# Patient Record
Sex: Female | Born: 1965
Health system: Southern US, Community
[De-identification: ages and names within clinical notes are randomized; demographics above are authoritative.]

## PROBLEM LIST (undated history)

## (undated) DIAGNOSIS — E0590 Thyrotoxicosis, unspecified without thyrotoxic crisis or storm: Secondary | ICD-10-CM

## (undated) DIAGNOSIS — I890 Lymphedema, not elsewhere classified: Secondary | ICD-10-CM

## (undated) DIAGNOSIS — E785 Hyperlipidemia, unspecified: Secondary | ICD-10-CM

## (undated) DIAGNOSIS — J45909 Unspecified asthma, uncomplicated: Secondary | ICD-10-CM

## (undated) DIAGNOSIS — J40 Bronchitis, not specified as acute or chronic: Secondary | ICD-10-CM

## (undated) DIAGNOSIS — I10 Essential (primary) hypertension: Secondary | ICD-10-CM

## (undated) HISTORY — PX: OTHER SURGICAL HISTORY: SHX169

## (undated) HISTORY — DX: Thyrotoxicosis, unspecified without thyrotoxic crisis or storm: E05.90

## (undated) HISTORY — DX: Hyperlipidemia, unspecified: E78.5

## (undated) HISTORY — DX: Lymphedema, not elsewhere classified: I89.0

---

## 2001-02-25 ENCOUNTER — Encounter: Payer: Self-pay | Admitting: Internal Medicine

## 2001-02-25 ENCOUNTER — Ambulatory Visit (HOSPITAL_COMMUNITY): Admission: RE | Admit: 2001-02-25 | Discharge: 2001-02-25 | Payer: Self-pay | Admitting: Internal Medicine

## 2002-03-05 ENCOUNTER — Ambulatory Visit (HOSPITAL_COMMUNITY): Admission: RE | Admit: 2002-03-05 | Discharge: 2002-03-05 | Payer: Self-pay | Admitting: Internal Medicine

## 2002-03-05 ENCOUNTER — Encounter: Payer: Self-pay | Admitting: Internal Medicine

## 2007-08-25 ENCOUNTER — Ambulatory Visit (HOSPITAL_COMMUNITY): Admission: RE | Admit: 2007-08-25 | Discharge: 2007-08-25 | Payer: Self-pay | Admitting: Internal Medicine

## 2008-03-08 ENCOUNTER — Encounter (HOSPITAL_COMMUNITY): Admission: RE | Admit: 2008-03-08 | Discharge: 2008-04-07 | Payer: Self-pay | Admitting: Internal Medicine

## 2012-01-06 ENCOUNTER — Emergency Department (HOSPITAL_COMMUNITY): Payer: 59

## 2012-01-06 ENCOUNTER — Encounter (HOSPITAL_COMMUNITY): Payer: Self-pay | Admitting: *Deleted

## 2012-01-06 ENCOUNTER — Observation Stay (HOSPITAL_COMMUNITY)
Admission: EM | Admit: 2012-01-06 | Discharge: 2012-01-07 | Disposition: A | Discharge status: Home / Self Care | Payer: 59 | Attending: Family Medicine | Admitting: Family Medicine

## 2012-01-06 DIAGNOSIS — J209 Acute bronchitis, unspecified: Secondary | ICD-10-CM | POA: Diagnosis present

## 2012-01-06 DIAGNOSIS — I10 Essential (primary) hypertension: Secondary | ICD-10-CM | POA: Diagnosis present

## 2012-01-06 DIAGNOSIS — E119 Type 2 diabetes mellitus without complications: Secondary | ICD-10-CM | POA: Diagnosis present

## 2012-01-06 DIAGNOSIS — J40 Bronchitis, not specified as acute or chronic: Principal | ICD-10-CM | POA: Insufficient documentation

## 2012-01-06 DIAGNOSIS — Z79899 Other long term (current) drug therapy: Secondary | ICD-10-CM | POA: Insufficient documentation

## 2012-01-06 DIAGNOSIS — F32A Depression, unspecified: Secondary | ICD-10-CM | POA: Diagnosis present

## 2012-01-06 DIAGNOSIS — I890 Lymphedema, not elsewhere classified: Secondary | ICD-10-CM | POA: Insufficient documentation

## 2012-01-06 DIAGNOSIS — R0602 Shortness of breath: Secondary | ICD-10-CM | POA: Insufficient documentation

## 2012-01-06 DIAGNOSIS — F3289 Other specified depressive episodes: Secondary | ICD-10-CM | POA: Insufficient documentation

## 2012-01-06 DIAGNOSIS — R739 Hyperglycemia, unspecified: Secondary | ICD-10-CM

## 2012-01-06 DIAGNOSIS — E876 Hypokalemia: Secondary | ICD-10-CM | POA: Diagnosis present

## 2012-01-06 DIAGNOSIS — F329 Major depressive disorder, single episode, unspecified: Secondary | ICD-10-CM | POA: Insufficient documentation

## 2012-01-06 DIAGNOSIS — R059 Cough, unspecified: Secondary | ICD-10-CM | POA: Insufficient documentation

## 2012-01-06 DIAGNOSIS — R05 Cough: Secondary | ICD-10-CM | POA: Insufficient documentation

## 2012-01-06 HISTORY — DX: Bronchitis, not specified as acute or chronic: J40

## 2012-01-06 HISTORY — DX: Essential (primary) hypertension: I10

## 2012-01-06 LAB — BASIC METABOLIC PANEL
CO2: 23 mEq/L (ref 19–32)
Chloride: 94 mEq/L — ABNORMAL LOW (ref 96–112)
Creatinine, Ser: 0.6 mg/dL (ref 0.50–1.10)
Potassium: 2.9 mEq/L — ABNORMAL LOW (ref 3.5–5.1)

## 2012-01-06 LAB — HEPATIC FUNCTION PANEL
ALT: 17 U/L (ref 0–35)
AST: 25 U/L (ref 0–37)
Bilirubin, Direct: <0.1 mg/dL (ref 0.0–0.3)
Total Bilirubin: 0.5 mg/dL (ref 0.3–1.2)

## 2012-01-06 LAB — DIFFERENTIAL
Basophils Absolute: 0.1 10*3/uL (ref 0.0–0.1)
Lymphocytes Relative: 13 % (ref 12–46)
Monocytes Absolute: 0.1 10*3/uL (ref 0.1–1.0)
Neutro Abs: 6.2 10*3/uL (ref 1.7–7.7)
Neutrophils Relative %: 85 % — ABNORMAL HIGH (ref 43–77)

## 2012-01-06 LAB — HEMOGLOBIN A1C
Hgb A1c MFr Bld: 7.6 % — ABNORMAL HIGH (ref <5.7)
Mean Plasma Glucose: 171 mg/dL — ABNORMAL HIGH (ref <117)

## 2012-01-06 LAB — CBC
HCT: 40.9 % (ref 36.0–46.0)
Hemoglobin: 13.9 g/dL (ref 12.0–15.0)
RDW: 12.5 % (ref 11.5–15.5)
WBC: 7.3 10*3/uL (ref 4.0–10.5)

## 2012-01-06 MED ORDER — METOPROLOL TARTRATE 100 MG PO TABS
100.0000 mg | ORAL_TABLET | Freq: Every day | ORAL | Status: DC
  Administered 2012-01-06 – 2012-01-07 (×2): 100 mg via ORAL
  Filled 2012-01-06 (×3): qty 1

## 2012-01-06 MED ORDER — HYDROCODONE-ACETAMINOPHEN 5-325 MG PO TABS: 1.0000 | ORAL_TABLET | ORAL | Status: DC | PRN

## 2012-01-06 MED ORDER — POTASSIUM CHLORIDE CRYS ER 20 MEQ PO TBCR
40.0000 meq | EXTENDED_RELEASE_TABLET | Freq: Once | ORAL | Status: AC
  Administered 2012-01-06: 40 meq via ORAL
  Filled 2012-01-06: qty 2

## 2012-01-06 MED ORDER — DEXTROMETHORPHAN-GUAIFENESIN 10-100 MG/5ML PO LIQD
10.0000 mL | ORAL | Status: DC | PRN
  Filled 2012-01-06: qty 10

## 2012-01-06 MED ORDER — ALBUTEROL SULFATE (5 MG/ML) 0.5% IN NEBU
2.5000 mg | INHALATION_SOLUTION | Freq: Four times a day (QID) | RESPIRATORY_TRACT | Status: DC
  Administered 2012-01-06 – 2012-01-07 (×4): 2.5 mg via RESPIRATORY_TRACT
  Filled 2012-01-06 (×4): qty 0.5

## 2012-01-06 MED ORDER — ONDANSETRON HCL 4 MG PO TABS: 4.0000 mg | ORAL_TABLET | Freq: Four times a day (QID) | ORAL | Status: DC | PRN

## 2012-01-06 MED ORDER — GUAIFENESIN-DM 100-10 MG/5ML PO SYRP: 10.0000 mL | ORAL_SOLUTION | ORAL | Status: DC | PRN

## 2012-01-06 MED ORDER — SODIUM CHLORIDE 0.9 % IV SOLN: 250.0000 mL | INTRAVENOUS | Status: DC | PRN

## 2012-01-06 MED ORDER — ENOXAPARIN SODIUM 40 MG/0.4ML ~~LOC~~ SOLN: 40.0000 mg | SUBCUTANEOUS | Status: DC

## 2012-01-06 MED ORDER — METHYLPREDNISOLONE SODIUM SUCC 125 MG IJ SOLR
80.0000 mg | Freq: Three times a day (TID) | INTRAMUSCULAR | Status: DC
  Administered 2012-01-06 – 2012-01-07 (×3): 80 mg via INTRAVENOUS
  Filled 2012-01-06 (×4): qty 1.28
  Filled 2012-01-06: qty 2
  Filled 2012-01-06: qty 1.28

## 2012-01-06 MED ORDER — IPRATROPIUM BROMIDE 0.02 % IN SOLN
0.5000 mg | Freq: Once | RESPIRATORY_TRACT | Status: AC
  Administered 2012-01-06: 0.5 mg via RESPIRATORY_TRACT
  Filled 2012-01-06: qty 2.5

## 2012-01-06 MED ORDER — HYDROCHLOROTHIAZIDE 25 MG PO TABS
25.0000 mg | ORAL_TABLET | Freq: Every day | ORAL | Status: DC
  Administered 2012-01-06 – 2012-01-07 (×2): 25 mg via ORAL
  Filled 2012-01-06 (×3): qty 1

## 2012-01-06 MED ORDER — ENOXAPARIN SODIUM 40 MG/0.4ML ~~LOC~~ SOLN
40.0000 mg | SUBCUTANEOUS | Status: DC
  Filled 2012-01-06: qty 0.4

## 2012-01-06 MED ORDER — METFORMIN HCL ER 500 MG PO TB24
500.0000 mg | ORAL_TABLET | Freq: Two times a day (BID) | ORAL | Status: DC
  Administered 2012-01-07: 500 mg via ORAL
  Filled 2012-01-06 (×4): qty 1

## 2012-01-06 MED ORDER — SODIUM CHLORIDE 0.9 % IJ SOLN
3.0000 mL | Freq: Two times a day (BID) | INTRAMUSCULAR | Status: DC
  Administered 2012-01-06: 3 mL via INTRAVENOUS

## 2012-01-06 MED ORDER — GLIPIZIDE 10 MG PO TABS
10.0000 mg | ORAL_TABLET | Freq: Two times a day (BID) | ORAL | Status: DC
  Administered 2012-01-07: 10 mg via ORAL
  Filled 2012-01-06 (×4): qty 1

## 2012-01-06 MED ORDER — ENOXAPARIN SODIUM 80 MG/0.8ML ~~LOC~~ SOLN
80.0000 mg | SUBCUTANEOUS | Status: DC
  Administered 2012-01-06: 80 mg via SUBCUTANEOUS
  Filled 2012-01-06 (×3): qty 0.8

## 2012-01-06 MED ORDER — CITALOPRAM HYDROBROMIDE 20 MG PO TABS
20.0000 mg | ORAL_TABLET | Freq: Every day | ORAL | Status: DC
  Administered 2012-01-06 – 2012-01-07 (×2): 20 mg via ORAL
  Filled 2012-01-06 (×3): qty 1

## 2012-01-06 MED ORDER — ALBUTEROL SULFATE (5 MG/ML) 0.5% IN NEBU
2.5000 mg | INHALATION_SOLUTION | Freq: Once | RESPIRATORY_TRACT | Status: AC
  Administered 2012-01-06: 2.5 mg via RESPIRATORY_TRACT
  Filled 2012-01-06: qty 0.5

## 2012-01-06 MED ORDER — SODIUM CHLORIDE 0.9 % IJ SOLN
3.0000 mL | INTRAMUSCULAR | Status: DC | PRN
  Administered 2012-01-07: 3 mL via INTRAVENOUS

## 2012-01-06 MED ORDER — POTASSIUM CHLORIDE 10 MEQ/100ML IV SOLN
10.0000 meq | Freq: Once | INTRAVENOUS | Status: AC
  Administered 2012-01-06: 10 meq via INTRAVENOUS
  Filled 2012-01-06: qty 100

## 2012-01-06 MED ORDER — ACETAMINOPHEN 650 MG RE SUPP: 650.0000 mg | Freq: Four times a day (QID) | RECTAL | Status: DC | PRN

## 2012-01-06 MED ORDER — GUAIFENESIN ER 600 MG PO TB12
600.0000 mg | ORAL_TABLET | Freq: Two times a day (BID) | ORAL | Status: DC
  Administered 2012-01-06 – 2012-01-07 (×2): 600 mg via ORAL
  Filled 2012-01-06 (×5): qty 1

## 2012-01-06 MED ORDER — ALBUTEROL SULFATE (5 MG/ML) 0.5% IN NEBU: 2.5000 mg | INHALATION_SOLUTION | RESPIRATORY_TRACT | Status: DC | PRN

## 2012-01-06 MED ORDER — ONDANSETRON HCL 4 MG/2ML IJ SOLN: 4.0000 mg | Freq: Four times a day (QID) | INTRAMUSCULAR | Status: DC | PRN

## 2012-01-06 MED ORDER — IPRATROPIUM BROMIDE 0.02 % IN SOLN
0.5000 mg | Freq: Four times a day (QID) | RESPIRATORY_TRACT | Status: DC
  Administered 2012-01-06 – 2012-01-07 (×4): 0.5 mg via RESPIRATORY_TRACT
  Filled 2012-01-06 (×4): qty 2.5

## 2012-01-06 MED ORDER — ACETAMINOPHEN 325 MG PO TABS: 650.0000 mg | ORAL_TABLET | Freq: Four times a day (QID) | ORAL | Status: DC | PRN

## 2012-01-06 NOTE — ED Notes (Signed)
Report called to floor

## 2012-01-06 NOTE — H&P (Signed)
Hospital Admission Note Date: 01/06/2012  Patient name: Sharon Garcia Medical record number: [id number] Date of birth: Sep 04, 1966 Age: 46 y.o. Gender: female PCP: Cassell Smiles., MD, MD  Attending physician: Hillery Aldo, MD Emergency Contact:   Ova Freshwater (husband) [phone] Code Status: Full  Chief Complaint: Dyspnea  History of Present Illness: Sharon Garcia is an 46 y.o. female with PMH of bronchitis who presented to the hospital with a 2 week history of progressive shortness of breath.  She also noticed a cough prior to the onset of her symptoms (+ sick contacts at work with multiple co-workers having a similar cough).  She has had some yellow sputum as well.  No known history of asthma.  No fever or chills.  Exertion makes the dyspnea and cough worse, nothing has improved the cough including a home remedy "moonshine" liquor that she tried the past 2 nights.  She was seen in the Urgent care, and was given steroids but that this has not alleviated her symptoms.  She ultimately was brought to the hospital via EMS and had several breathing treatments in the ER with ongoing bronchospasm, so she was referred to the hospitalist service for further evaluation and treatment.  Past Medical History Past Medical History  Diagnosis Date  . Hypertension   . Diabetes mellitus     Past Surgical History History reviewed. No pertinent past surgical history.  Meds: Prior to Admission medications   Medication Sig Start Date End Date Taking? Authorizing Provider  citalopram (CELEXA) 20 MG tablet Take 20 mg by mouth daily.   Yes Historical Provider, MD  dextromethorphan-guaiFENesin (ROBITUSSIN-DM) 10-100 MG/5ML liquid Take 10 mLs by mouth every 4 (four) hours as needed. Cough and congestion   Yes Historical Provider, MD  glipiZIDE (GLUCOTROL) 10 MG tablet Take 10 mg by mouth 2 (two) times daily before a meal.   Yes Historical Provider, MD  hydrochlorothiazide (HYDRODIURIL) 25 MG tablet  Take 25 mg by mouth daily.   Yes Historical Provider, MD  HYDROcodone-acetaminophen (NORCO) 5-325 MG per tablet Take 1-2 tablets by mouth every 4 (four) hours as needed. pain   Yes Historical Provider, MD  metFORMIN (GLUCOPHAGE-XR) 500 MG 24 hr tablet Take 500 mg by mouth 2 (two) times daily.   Yes Historical Provider, MD  metoprolol (LOPRESSOR) 100 MG tablet Take 100 mg by mouth daily.   Yes Historical Provider, MD    Allergies: Review of patient's allergies indicates no known allergies.  Social History: History   Social History  . Marital Status: Married    Spouse Name: Earlene Plater    Number of Children: 1  . Years of Education: 14   Occupational History  . Accounts Receivable [employer]   Social History Main Topics  . Smoking status: Former Smoker -- 0.3 packs/day for 10 years    Types: Cigarettes  . Smokeless tobacco: Never Used  . Alcohol Use: No  . Drug Use: No  . Sexually Active: Not on file   Other Topics Concern  . Not on file   Social History Narrative   Married.  Lives in [place] with husband.  Independent of ADLs.    Family History:  Family History  Problem Relation Age of Onset  . Heart disease Father   . Kidney disease Mother   . Multiple myeloma Mother   . Diabetes Mother   . Diabetes Father   . Lung cancer Maternal Grandmother     Review of Systems: Constitutional: No fever or chills;  Appetite decreased;  No weight loss.  HEENT: No blurry vision or diplopia, no pharyngitis or dysphagia CV: No chest pain or arrhythmia.  Resp: +SOB, +cough. GI: No N/V, + chronic loose stool but no frank diarrhea, no melena or hematochezia.  GU: No dysuria or hematuria.  MSK: no myalgias/+ knee arthralgias.  Neuro:  + headache but no focal neurological deficits.  Psych: + history of depression, no anxiety.  Endo: No thyroid disease, + DM.  Skin: No rashes or lesions.  Heme: No anemia or blood dyscrasia   Physical Exam: Blood pressure 171/76, pulse 106, temperature 98.4 F (36.9  C), temperature source Oral, resp. rate 22, last menstrual period 12/23/2011, SpO2 97.00%. BP 171/76  Pulse 106  Temp(Src) 98.4 F (36.9 C) (Oral)  Resp 22  SpO2 97%  LMP 12/23/2011  General Appearance:    Alert, cooperative, no distress, appears stated age  Head:    Normocephalic, without obvious abnormality, atraumatic  Eyes:    PERRL, conjunctiva/corneas clear, EOM's intact  Ears:    Normal external ear canals, both ears  Nose:   Nares normal, septum midline, mucosa normal, no drainage    or sinus tenderness  Throat:   Lips, mucosa, and tongue normal; teeth and gums normal  Neck:   Supple, symmetrical, trachea midline, no adenopathy;    thyroid:  no enlargement/tenderness/nodules; no carotid   bruit or JVD  Back:     Symmetric, no curvature, ROM normal, no CVA tenderness  Lungs:     Course to auscultation bilaterally, with an occasional wheeze; respirations unlabored  Chest Wall:    No tenderness or deformity   Heart:    Regular rate and rhythm, S1 and S2 normal, no murmur, rub   or gallop  Abdomen:     Soft, non-tender, bowel sounds active all four quadrants,    no masses, no organomegaly  Extremities:   Extremities normal, atraumatic, no cyanosis or edema  Pulses:   2+ and symmetric all extremities  Skin:   Skin color, texture, turgor normal, no rashes or lesions  Neurologic:   Non-focal   Lab results: Basic Metabolic Panel:  Lab 01/06/12 4540  NA 137  K 2.9*  CL 94*  CO2 23  GLUCOSE 348*  BUN 15  CREATININE 0.60  CALCIUM 10.3  MG --  PHOS --   GFR CrCl is unknown because there is no height on file for the current visit.  CBC:  Lab 01/06/12 1540  WBC 7.3  NEUTROABS 6.2  HGB 13.9  HCT 40.9  MCV 84.2  PLT 281    Imaging results:  Dg Chest 2 View  01/06/2012  *RADIOLOGY REPORT*  Clinical Data: Shortness of breath  CHEST - 2 VIEW  Comparison: None  Findings: The heart size and mediastinal contours are within normal limits.  Both lungs are clear.  The  visualized skeletal structures are unremarkable.  IMPRESSION: Negative exam.  Original Report Authenticated By: [name], M.D.    Assessment & Plan: Principal Problem:  *Bronchitis with bronchospasm The patient will be admitted for 23 hour observation.  We will place her on Solumedrol 80 mg every 8 hours with a rapid taper, as tolerated.  We will place her on scheduled bronchodilator treatment, and as needed anti-tussives.  There is no evidence of pneumonia on chest radiography and her WBC count is WNL, so will not put on empiric antibiotics at present, as this is likely a virally induced illness, but will send off a sputum sample if she is  able to provide one, for analysis. Active Problems:  Hypokalemia Likely from treatment with diuretics and bronchodilators.  Repleted by ED physician.  Check magnesium.  DM (diabetes mellitus) Will check hemoglobin A1c and add SSI, moderate scale, to her oral hypoglycemics.  Morbid obesity Will request a dietician evaluation.  Will ask nursing to get a height and weight to establish BMI.  HTN (hypertension) Continue home anti-hypertensives.  Depression Continue SSRI.  Prophylaxis: Lovenox for DVT prophylaxis.  Time Spent On Admission: 50 minutes.  Eden Toohey 01/06/2012, 4:48 PM Pager [phone]

## 2012-01-06 NOTE — ED Provider Notes (Signed)
History     CSN: [id number]  Arrival date & time 01/06/12  1214   First MD Initiated Contact with Patient 01/06/12 1414      Chief Complaint  Patient presents with  . Shortness of Breath    (Consider location/radiation/quality/duration/timing/severity/associated sxs/prior treatment) Patient is a 46 y.o. female presenting with shortness of breath. The history is provided by the patient.  Shortness of Breath  Associated symptoms include shortness of breath.  She has had a cough for the last 4 weeks, and has been having difficulty breathing for the last 2 weeks. Cough is productive of yellowish sputum. She denies fever, chills, sweats. Dyspnea is worse with exertion and with lying flat. She denies chest pain, heaviness, tightness, or pressure. She denies nausea or vomiting. She does have a history of having had bronchitis in the past. She went to an urgent care Center today where she was diagnosed with bronchitis and given 2 breathing treatments which did not give her only slight relief. She has had another breathing treatment in the emergency department with only slight relief. Symptoms are severe. Nothing affects the cough. She has been taking over-the-counter cough medicine with no benefit. She also states that she was given a dose of steroid in the urgent care Center and again in the ambulance coming to the hospital.  Past Medical History  Diagnosis Date  . Hypertension   . Diabetes mellitus     History reviewed. No pertinent past surgical history.  No family history on file.  History  Substance Use Topics  . Smoking status: Not on file  . Smokeless tobacco: Not on file  . Alcohol Use: No    OB History    Grav Para Term Preterm Abortions TAB SAB Ect Mult Living                  Review of Systems  Respiratory: Positive for shortness of breath.   All other systems reviewed and are negative.    Allergies  Review of patient's allergies indicates no known  allergies.  Home Medications  No current outpatient prescriptions on file.  BP 154/82  Pulse 94  Temp(Src) 98.8 F (37.1 C) (Oral)  Resp 22  SpO2 91%  LMP 12/23/2011  Physical Exam  Nursing note and vitals reviewed.  46 year old female who is morbidly obese and in mild respiratory distress. Vital signs are significant for tachypnea with respiratory rate of 22 and hypertension with blood pressure 150 for bradycardia 2. Oxygen saturation is 91% which is hypoxic. During the time I am examining her, oxygen saturation on the monitor varied from 89-93 but were mainly 91%. Head is normocephalic and atraumatic. PERRLA, EOMI. Her Chalmers Guest is clear. Neck is nontender supple without adenopathy or JVD. Lungs have diffuse wheezes without rales or rhonchi. Heart has regular rate and rhythm without murmur. There is no chest wall tenderness. Abdomen is morbidly obese, soft, nontender without masses or hepatosplenomegaly. Extremities have moderate lymphedema but no pitting edema. There is full range of motion of all joints without pain. Skin is warm and dry without rash. Neurologic: Mental status is normal, cranial nerves are intact, there no focal motor or sensory deficits.  ED Course  Procedures (including critical care time)  Results for orders placed during the hospital encounter of 01/06/12  CBC      Component Value Range   WBC 7.3  4.0 - 10.5 (K/uL)   RBC 4.86  3.87 - 5.11 (MIL/uL)   Hemoglobin 13.9  12.0 - 15.0 (  g/dL)   HCT 91.4  78.2 - 95.6 (%)   MCV 84.2  78.0 - 100.0 (fL)   MCH 28.6  26.0 - 34.0 (pg)   MCHC 34.0  30.0 - 36.0 (g/dL)   RDW 21.3  08.6 - 57.8 (%)   Platelets 281  150 - 400 (K/uL)  DIFFERENTIAL      Component Value Range   Neutrophils Relative 85 (*) 43 - 77 (%)   Neutro Abs 6.2  1.7 - 7.7 (K/uL)   Lymphocytes Relative 13  12 - 46 (%)   Lymphs Abs 0.9  0.7 - 4.0 (K/uL)   Monocytes Relative 1 (*) 3 - 12 (%)   Monocytes Absolute 0.1  0.1 - 1.0 (K/uL)   Eosinophils Relative 1   0 - 5 (%)   Eosinophils Absolute 0.1  0.0 - 0.7 (K/uL)   Basophils Relative 1  0 - 1 (%)   Basophils Absolute 0.1  0.0 - 0.1 (K/uL)  BASIC METABOLIC PANEL      Component Value Range   Sodium 137  135 - 145 (mEq/L)   Potassium 2.9 (*) 3.5 - 5.1 (mEq/L)   Chloride 94 (*) 96 - 112 (mEq/L)   CO2 23  19 - 32 (mEq/L)   Glucose, Bld 348 (*) 70 - 99 (mg/dL)   BUN 15  6 - 23 (mg/dL)   Creatinine, Ser 4.69  0.50 - 1.10 (mg/dL)   Calcium 62.9  8.4 - 10.5 (mg/dL)   GFR calc non Af Amer >90  >90 (mL/min)   GFR calc Af Amer >90  >90 (mL/min)   Dg Chest 2 View  01/06/2012  *RADIOLOGY REPORT*  Clinical Data: Shortness of breath  CHEST - 2 VIEW  Comparison: None  Findings: The heart size and mediastinal contours are within normal limits.  Both lungs are clear.  The visualized skeletal structures are unremarkable.  IMPRESSION: Negative exam.  Original Report Authenticated By: [name], M.D.    She was given another dose of albuterol with Atrovent with no further improvement. She is given potassium because of hypokalemia. Case is discussed with Dr. Darnelle Catalan of [facility] who agrees to admit the patient.  1. Bronchitis   2. Hyperglycemia   3. Hypokalemia       MDM  Wheezing and dyspnea which most likely represents bronchitis. Chest x-ray will need to be obtained to rule out pneumonia. At this point, she has already failed outpatient management with 3 nebulizer treatments. She'll be given a dose of steroids. Because of her history of diabetes, her blood sugars will be watched for closely. She will need to be admitted.        Dione Booze, MD 01/06/12 [phone]

## 2012-01-06 NOTE — ED Notes (Signed)
Pt reports non-productive cough.  Pt was sent here via EMS from the [facility].  Has hx of bronchitis.  Pt reports normally when she has bronchitis, her chest would hurt but denies any cp a this time.  Pt reports receiving 3 breathing txs.

## 2012-01-06 NOTE — ED Notes (Signed)
RT called to administer breathing tx

## 2012-01-06 NOTE — Progress Notes (Signed)
confirmed [facility] as pcp on 01/06/12 

## 2012-01-06 NOTE — ED Notes (Signed)
Per EMS, pt reports diff. Breathing x 3 weeks, was picked up at the UC with bronchitis.  Not getting better.

## 2012-01-07 LAB — BASIC METABOLIC PANEL
BUN: 15 mg/dL (ref 6–23)
CO2: 26 mEq/L (ref 19–32)
Glucose, Bld: 344 mg/dL — ABNORMAL HIGH (ref 70–99)
Potassium: 3.6 mEq/L (ref 3.5–5.1)
Sodium: 135 mEq/L (ref 135–145)

## 2012-01-07 LAB — CBC
Hemoglobin: 13.5 g/dL (ref 12.0–15.0)
MCH: 28.4 pg (ref 26.0–34.0)
MCHC: 33.6 g/dL (ref 30.0–36.0)
MCV: 84.6 fL (ref 78.0–100.0)
RBC: 4.75 MIL/uL (ref 3.87–5.11)

## 2012-01-07 LAB — EXPECTORATED SPUTUM ASSESSMENT W GRAM STAIN, RFLX TO RESP C: Special Requests: NORMAL

## 2012-01-07 MED ORDER — PREDNISONE 20 MG PO TABS: 60.0000 mg | ORAL_TABLET | Freq: Every day | ORAL | Status: DC

## 2012-01-07 MED ORDER — ALBUTEROL SULFATE HFA 108 (90 BASE) MCG/ACT IN AERS
2.0000 | INHALATION_SPRAY | RESPIRATORY_TRACT | Status: DC | PRN
  Administered 2012-01-07: 2 via RESPIRATORY_TRACT
  Filled 2012-01-07: qty 6.7

## 2012-01-07 MED ORDER — PREDNISONE 20 MG PO TABS: 60.0000 mg | ORAL_TABLET | Freq: Every day | ORAL | Status: AC

## 2012-01-07 MED ORDER — ALBUTEROL SULFATE HFA 108 (90 BASE) MCG/ACT IN AERS: 2.0000 | INHALATION_SPRAY | RESPIRATORY_TRACT | Status: DC | PRN

## 2012-01-07 MED ORDER — PREDNISONE 50 MG PO TABS
60.0000 mg | ORAL_TABLET | Freq: Every day | ORAL | Status: DC
  Filled 2012-01-07: qty 1

## 2012-01-07 NOTE — Progress Notes (Signed)
[facility] Hospital Discharge Summary -Patient needs possibly an echocardiogram as an outpatient. She has chronic lower extremity lymphedema and has been child on Lasix, has had Doppler ultrasound. Next step would be to rule out CHF. -Patient given albuterol inhalers. May benefit from outpatient spirometry is thought necessary.  -patient may benefit from bariatric referral given multiple comorbidities and BMI over 40  Date of Admission: 01/06/2012 12:17 PM Admitter: @ADMITPROV @   Date of Discharge2/26/2013 Attending Physician: [name], MD  Brief Hospital history- 46-year-old female past medical bronchitis 2 week history progress shortness breath no cough at the onset of her symptoms (positive sick contacts at work) she had some yellow sputum no history of asthma no history fever chills nothing improved her cough and she went to the emergency room after being seen in urgent care. She was given steroids and brought to EMS and had several breathing treatments in the ED, her O2 sats were noted to be in the 91% range. Her cough drastically improved over the course of her hospital stay. She was transitioned from Solu-Medrol 80 mg daily 82 by mouth prednisone for 5 days. Her white count was within normal limits and this is thought to be more of a viral-induced illness.She is diabetic and will need outpatient evaluation for possible need of Bariatric surgery.  She was able to ambulate at hospital off of oxygen without any need for oxygen and her sats at 91% range. As such she was deemed stable and fit for discharge home, and will be educated on inhaler usage and how to use a spacer.  Procedures Performed and pertinent labs: Dg Chest 2 View  01/06/2012  *RADIOLOGY REPORT*  Clinical Data: Shortness of breath  CHEST - 2 VIEW  Comparison: None  Findings: The heart size and mediastinal contours are within normal limits.  Both lungs are clear.  The visualized skeletal structures are unremarkable.   IMPRESSION: Negative exam.  Original Report Authenticated By: [name], M.D.    Discharge Vitals & PE:  BP 138/80  Pulse 87  Temp(Src) 98.2 F (36.8 C) (Oral)  Resp 20  Ht 5\' 7"  (1.702 m)  Wt 163.295 kg (360 lb)  BMI 56.38 kg/m2  SpO2 94%  LMP 12/23/2011 Morbidly obese, alert, oriented Chest clinically clear but decreased lung sounds throughout Abdomen morbidly obese S1-S2 no murmur without Significant bilateral lymphedema. Not specifically pitting as it does appear to include muscular tissue as well.   Discharge Labs:  Results for orders placed during the hospital encounter of 01/06/12 (from the past 24 hour(s))  CBC     Status: Normal   Collection Time   01/06/12  3:40 PM      Component Value Range   WBC 7.3  4.0 - 10.5 (K/uL)   RBC 4.86  3.87 - 5.11 (MIL/uL)   Hemoglobin 13.9  12.0 - 15.0 (g/dL)   HCT 16.1  09.6 - 04.5 (%)   MCV 84.2  78.0 - 100.0 (fL)   MCH 28.6  26.0 - 34.0 (pg)   MCHC 34.0  30.0 - 36.0 (g/dL)   RDW 40.9  81.1 - 91.4 (%)   Platelets 281  150 - 400 (K/uL)  DIFFERENTIAL     Status: Abnormal   Collection Time   01/06/12  3:40 PM      Component Value Range   Neutrophils Relative 85 (*) 43 - 77 (%)   Neutro Abs 6.2  1.7 - 7.7 (K/uL)   Lymphocytes Relative 13  12 - 46 (%)  Lymphs Abs 0.9  0.7 - 4.0 (K/uL)   Monocytes Relative 1 (*) 3 - 12 (%)   Monocytes Absolute 0.1  0.1 - 1.0 (K/uL)   Eosinophils Relative 1  0 - 5 (%)   Eosinophils Absolute 0.1  0.0 - 0.7 (K/uL)   Basophils Relative 1  0 - 1 (%)   Basophils Absolute 0.1  0.0 - 0.1 (K/uL)  BASIC METABOLIC PANEL     Status: Abnormal   Collection Time   01/06/12  3:40 PM      Component Value Range   Sodium 137  135 - 145 (mEq/L)   Potassium 2.9 (*) 3.5 - 5.1 (mEq/L)   Chloride 94 (*) 96 - 112 (mEq/L)   CO2 23  19 - 32 (mEq/L)   Glucose, Bld 348 (*) 70 - 99 (mg/dL)   BUN 15  6 - 23 (mg/dL)   Creatinine, Ser 4.54  0.50 - 1.10 (mg/dL)   Calcium 09.8  8.4 - 10.5 (mg/dL)   GFR calc non Af  Amer >90  >90 (mL/min)   GFR calc Af Amer >90  >90 (mL/min)  HEMOGLOBIN A1C     Status: Abnormal   Collection Time   01/06/12  3:45 PM      Component Value Range   Hemoglobin A1C 7.6 (*) <5.7 (%)   Mean Plasma Glucose 171 (*) <117 (mg/dL)  HEPATIC FUNCTION PANEL     Status: Normal   Collection Time   01/06/12  3:45 PM      Component Value Range   Total Protein 7.9  6.0 - 8.3 (g/dL)   Albumin 3.8  3.5 - 5.2 (g/dL)   AST 25  0 - 37 (U/L)   ALT 17  0 - 35 (U/L)   Alkaline Phosphatase 60  39 - 117 (U/L)   Total Bilirubin 0.5  0.3 - 1.2 (mg/dL)   Bilirubin, Direct <1.1  0.0 - 0.3 (mg/dL)   Indirect Bilirubin NOT CALCULATED  0.3 - 0.9 (mg/dL)  MAGNESIUM     Status: Normal   Collection Time   01/06/12  3:45 PM      Component Value Range   Magnesium 1.5  1.5 - 2.5 (mg/dL)  BASIC METABOLIC PANEL     Status: Abnormal   Collection Time   01/07/12  3:26 AM      Component Value Range   Sodium 135  135 - 145 (mEq/L)   Potassium 3.6  3.5 - 5.1 (mEq/L)   Chloride 93 (*) 96 - 112 (mEq/L)   CO2 26  19 - 32 (mEq/L)   Glucose, Bld 344 (*) 70 - 99 (mg/dL)   BUN 15  6 - 23 (mg/dL)   Creatinine, Ser 9.14  0.50 - 1.10 (mg/dL)   Calcium 78.2  8.4 - 10.5 (mg/dL)   GFR calc non Af Amer >90  >90 (mL/min)   GFR calc Af Amer >90  >90 (mL/min)  CBC     Status: Abnormal   Collection Time   01/07/12  3:26 AM      Component Value Range   WBC 10.6 (*) 4.0 - 10.5 (K/uL)   RBC 4.75  3.87 - 5.11 (MIL/uL)   Hemoglobin 13.5  12.0 - 15.0 (g/dL)   HCT 95.6  21.3 - 08.6 (%)   MCV 84.6  78.0 - 100.0 (fL)   MCH 28.4  26.0 - 34.0 (pg)   MCHC 33.6  30.0 - 36.0 (g/dL)   RDW 57.8  46.9 - 62.9 (%)  Platelets 365  150 - 400 (K/uL)  CULTURE, SPUTUM-ASSESSMENT     Status: Normal   Collection Time   01/07/12  8:09 AM      Component Value Range   Specimen Description SPUTUM     Special Requests Normal     Sputum evaluation       Value: THIS SPECIMEN IS ACCEPTABLE. RESPIRATORY CULTURE REPORT TO FOLLOW.   Report  Status 01/07/2012 FINAL    CULTURE, RESPIRATORY     Status: Normal (Preliminary result)   Collection Time   01/07/12  8:09 AM      Component Value Range   Specimen Description SPUTUM     Special Requests NONE     Gram Stain       Value: NO WBC SEEN     RARE SQUAMOUS EPITHELIAL CELLS PRESENT     RARE GRAM POSITIVE COCCI     IN PAIRS RARE GRAM POSITIVE RODS     RARE GRAM NEGATIVE RODS   Culture PENDING     Report Status PENDING      Disposition and follow-up:    Follow-up Information    Follow up with Cassell Smiles., MD .         Ms.JOSCELYNE RENVILLE was discharged from in good  condition.    Follow-up Appointments: Discharge Orders    Future Orders Please Complete By Expires   Diet - low sodium heart healthy      Increase activity slowly      Call MD for:  temperature >100.4      Call MD for:  persistant nausea and vomiting      Call MD for:  persistant dizziness or light-headedness      Call MD for:  difficulty breathing, headache or visual disturbances         Discharge Medications: Medication List  As of 01/07/2012  1:32 PM   TAKE these medications         albuterol 108 (90 BASE) MCG/ACT inhaler   Commonly known as: PROVENTIL HFA;VENTOLIN HFA   Inhale 2 puffs into the lungs every 4 (four) hours as needed for wheezing or shortness of breath.      citalopram 20 MG tablet   Commonly known as: CELEXA   Take 20 mg by mouth daily.      dextromethorphan-guaiFENesin 10-100 MG/5ML liquid   Commonly known as: ROBITUSSIN-DM   Take 10 mLs by mouth every 4 (four) hours as needed. Cough and congestion      gabapentin 300 MG capsule   Commonly known as: NEURONTIN   Take 600 mg by mouth at bedtime.      glipiZIDE 10 MG tablet   Commonly known as: GLUCOTROL   Take 10 mg by mouth 2 (two) times daily before a meal.      hydrochlorothiazide 25 MG tablet   Commonly known as: HYDRODIURIL   Take 25 mg by mouth daily.      HYDROcodone-acetaminophen 5-325 MG per tablet    Commonly known as: NORCO   Take 1-2 tablets by mouth every 4 (four) hours as needed. pain      metFORMIN 500 MG 24 hr tablet   Commonly known as: GLUCOPHAGE-XR   Take 500 mg by mouth 2 (two) times daily.      metoprolol 100 MG tablet   Commonly known as: LOPRESSOR   Take 100 mg by mouth daily.      predniSONE 20 MG tablet   Commonly known as: DELTASONE  Take 3 tablets (60 mg total) by mouth daily before breakfast.           Medications Discontinued During This Encounter  Medication Reason  . dextromethorphan-guaiFENesin (ROBITUSSIN-DM) 10-100 MG/5ML liquid 10 mL Duplicate  . enoxaparin (LOVENOX) injection 40 mg   . enoxaparin (LOVENOX) injection 40 mg Dose change  . enoxaparin (LOVENOX) injection 40 mg Dose change  . methylPREDNISolone sodium succinate (SOLU-MEDROL) 125 MG injection 80 mg   . albuterol (PROVENTIL) (5 MG/ML) 0.5% nebulizer solution 2.5 mg     Signed: Bhavika Schnider,JAI 01/07/2012, 1:32 PM

## 2012-01-07 NOTE — Progress Notes (Signed)
Inpatient Diabetes Program Recommendations  AACE/ADA: New Consensus Statement on Inpatient Glycemic Control (2009)  Target Ranges:  Prepandial:   less than 140 mg/dL      Peak postprandial:   less than 180 mg/dL (1-2 hours)      Critically ill patients:  140 - 180 mg/dL   Reason for Visit: Hyperglycemia  Results for Sharon Garcia, Sharon Garcia (MRN [id number]) as of 01/07/2012 13:29  Ref. Range 01/06/2012 15:40 01/07/2012 03:26  Glucose Latest Range: 70-99 mg/dL 409 (H) 811 (H)  Results for Sharon Garcia, Sharon Garcia (MRN [id number]) as of 01/07/2012 13:29  Ref. Range 01/06/2012 15:45  Hemoglobin A1C Latest Range: <5.7 % 7.6 (H)    Inpatient Diabetes Program Recommendations Insulin - Basal: Add Lantus 10 units QHS Correction (SSI): Add Novolog moderate tidwc and hs Outpatient Referral: OP Diabetes Education consult for HgbA1C > 7.  Note: Will follow.

## 2012-01-09 LAB — CULTURE, RESPIRATORY W GRAM STAIN

## 2012-01-10 NOTE — Discharge Summary (Signed)
[facility] Hospital Discharge Summary -Patient needs possibly an echocardiogram as an outpatient. She has chronic lower extremity lymphedema and has been child on Lasix, has had Doppler ultrasound. Next step would be to rule out CHF. -Patient given albuterol inhalers. May benefit from outpatient spirometry is thought necessary.  -patient may benefit from bariatric referral given multiple comorbidities and BMI over 40  Date of Admission: 01/06/2012 12:17 PM Admitter: @ADMITPROV@   Date of Discharge2/26/2013 Attending Physician: Jai Cincere Deprey, MD  Brief Hospital history- 46-year-old female past medical bronchitis 2 week history progress shortness breath no cough at the onset of her symptoms (positive sick contacts at work) she had some yellow sputum no history of asthma no history fever chills nothing improved her cough and she went to the emergency room after being seen in urgent care. She was given steroids and brought to EMS and had several breathing treatments in the ED, her O2 sats were noted to be in the 91% range. Her cough drastically improved over the course of her hospital stay. She was transitioned from Solu-Medrol 80 mg daily 82 by mouth prednisone for 5 days. Her white count was within normal limits and this is thought to be more of a viral-induced illness.She is diabetic and will need outpatient evaluation for possible need of Bariatric surgery.  She was able to ambulate at hospital off of oxygen without any need for oxygen and her sats at 91% range. As such she was deemed stable and fit for discharge home, and will be educated on inhaler usage and how to use a spacer.  Procedures Performed and pertinent labs: Dg Chest 2 View  01/06/2012  *RADIOLOGY REPORT*  Clinical Data: Shortness of breath  CHEST - 2 VIEW  Comparison: None  Findings: The heart size and mediastinal contours are within normal limits.  Both lungs are clear.  The visualized skeletal structures are unremarkable.   IMPRESSION: Negative exam.  Original Report Authenticated By: TAYLOR H. STROUD, M.D.    Discharge Vitals & PE:  BP 138/80  Pulse 87  Temp(Src) 98.2 F (36.8 C) (Oral)  Resp 20  Ht 5' 7" (1.702 m)  Wt 163.295 kg (360 lb)  BMI 56.38 kg/m2  SpO2 94%  LMP 12/23/2011 Morbidly obese, alert, oriented Chest clinically clear but decreased lung sounds throughout Abdomen morbidly obese S1-S2 no murmur without Significant bilateral lymphedema. Not specifically pitting as it does appear to include muscular tissue as well.   Discharge Labs:  Results for orders placed during the hospital encounter of 01/06/12 (from the past 24 hour(s))  CBC     Status: Normal   Collection Time   01/06/12  3:40 PM      Component Value Range   WBC 7.3  4.0 - 10.5 (K/uL)   RBC 4.86  3.87 - 5.11 (MIL/uL)   Hemoglobin 13.9  12.0 - 15.0 (g/dL)   HCT 40.9  36.0 - 46.0 (%)   MCV 84.2  78.0 - 100.0 (fL)   MCH 28.6  26.0 - 34.0 (pg)   MCHC 34.0  30.0 - 36.0 (g/dL)   RDW 12.5  11.5 - 15.5 (%)   Platelets 281  150 - 400 (K/uL)  DIFFERENTIAL     Status: Abnormal   Collection Time   01/06/12  3:40 PM      Component Value Range   Neutrophils Relative 85 (*) 43 - 77 (%)   Neutro Abs 6.2  1.7 - 7.7 (K/uL)   Lymphocytes Relative 13  12 - 46 (%)     Lymphs Abs 0.9  0.7 - 4.0 (K/uL)   Monocytes Relative 1 (*) 3 - 12 (%)   Monocytes Absolute 0.1  0.1 - 1.0 (K/uL)   Eosinophils Relative 1  0 - 5 (%)   Eosinophils Absolute 0.1  0.0 - 0.7 (K/uL)   Basophils Relative 1  0 - 1 (%)   Basophils Absolute 0.1  0.0 - 0.1 (K/uL)  BASIC METABOLIC PANEL     Status: Abnormal   Collection Time   01/06/12  3:40 PM      Component Value Range   Sodium 137  135 - 145 (mEq/L)   Potassium 2.9 (*) 3.5 - 5.1 (mEq/L)   Chloride 94 (*) 96 - 112 (mEq/L)   CO2 23  19 - 32 (mEq/L)   Glucose, Bld 348 (*) 70 - 99 (mg/dL)   BUN 15  6 - 23 (mg/dL)   Creatinine, Ser 0.60  0.50 - 1.10 (mg/dL)   Calcium 10.3  8.4 - 10.5 (mg/dL)   GFR calc non Af  Amer >90  >90 (mL/min)   GFR calc Af Amer >90  >90 (mL/min)  HEMOGLOBIN A1C     Status: Abnormal   Collection Time   01/06/12  3:45 PM      Component Value Range   Hemoglobin A1C 7.6 (*) <5.7 (%)   Mean Plasma Glucose 171 (*) <117 (mg/dL)  HEPATIC FUNCTION PANEL     Status: Normal   Collection Time   01/06/12  3:45 PM      Component Value Range   Total Protein 7.9  6.0 - 8.3 (g/dL)   Albumin 3.8  3.5 - 5.2 (g/dL)   AST 25  0 - 37 (U/L)   ALT 17  0 - 35 (U/L)   Alkaline Phosphatase 60  39 - 117 (U/L)   Total Bilirubin 0.5  0.3 - 1.2 (mg/dL)   Bilirubin, Direct <0.1  0.0 - 0.3 (mg/dL)   Indirect Bilirubin NOT CALCULATED  0.3 - 0.9 (mg/dL)  MAGNESIUM     Status: Normal   Collection Time   01/06/12  3:45 PM      Component Value Range   Magnesium 1.5  1.5 - 2.5 (mg/dL)  BASIC METABOLIC PANEL     Status: Abnormal   Collection Time   01/07/12  3:26 AM      Component Value Range   Sodium 135  135 - 145 (mEq/L)   Potassium 3.6  3.5 - 5.1 (mEq/L)   Chloride 93 (*) 96 - 112 (mEq/L)   CO2 26  19 - 32 (mEq/L)   Glucose, Bld 344 (*) 70 - 99 (mg/dL)   BUN 15  6 - 23 (mg/dL)   Creatinine, Ser 0.52  0.50 - 1.10 (mg/dL)   Calcium 10.3  8.4 - 10.5 (mg/dL)   GFR calc non Af Amer >90  >90 (mL/min)   GFR calc Af Amer >90  >90 (mL/min)  CBC     Status: Abnormal   Collection Time   01/07/12  3:26 AM      Component Value Range   WBC 10.6 (*) 4.0 - 10.5 (K/uL)   RBC 4.75  3.87 - 5.11 (MIL/uL)   Hemoglobin 13.5  12.0 - 15.0 (g/dL)   HCT 40.2  36.0 - 46.0 (%)   MCV 84.6  78.0 - 100.0 (fL)   MCH 28.4  26.0 - 34.0 (pg)   MCHC 33.6  30.0 - 36.0 (g/dL)   RDW 12.6  11.5 - 15.5 (%)     Platelets 365  150 - 400 (K/uL)  CULTURE, SPUTUM-ASSESSMENT     Status: Normal   Collection Time   01/07/12  8:09 AM      Component Value Range   Specimen Description SPUTUM     Special Requests Normal     Sputum evaluation       Value: THIS SPECIMEN IS ACCEPTABLE. RESPIRATORY CULTURE REPORT TO FOLLOW.   Report  Status 01/07/2012 FINAL    CULTURE, RESPIRATORY     Status: Normal (Preliminary result)   Collection Time   01/07/12  8:09 AM      Component Value Range   Specimen Description SPUTUM     Special Requests NONE     Gram Stain       Value: NO WBC SEEN     RARE SQUAMOUS EPITHELIAL CELLS PRESENT     RARE GRAM POSITIVE COCCI     IN PAIRS RARE GRAM POSITIVE RODS     RARE GRAM NEGATIVE RODS   Culture PENDING     Report Status PENDING      Disposition and follow-up:    Follow-up Information    Follow up with FUSCO,LAWRENCE J., MD .         Ms.Kathelyn K Evenson was discharged from in good  condition.    Follow-up Appointments: Discharge Orders    Future Orders Please Complete By Expires   Diet - low sodium heart healthy      Increase activity slowly      Call MD for:  temperature >100.4      Call MD for:  persistant nausea and vomiting      Call MD for:  persistant dizziness or light-headedness      Call MD for:  difficulty breathing, headache or visual disturbances         Discharge Medications: Medication List  As of 01/07/2012  1:32 PM   TAKE these medications         albuterol 108 (90 BASE) MCG/ACT inhaler   Commonly known as: PROVENTIL HFA;VENTOLIN HFA   Inhale 2 puffs into the lungs every 4 (four) hours as needed for wheezing or shortness of breath.      citalopram 20 MG tablet   Commonly known as: CELEXA   Take 20 mg by mouth daily.      dextromethorphan-guaiFENesin 10-100 MG/5ML liquid   Commonly known as: ROBITUSSIN-DM   Take 10 mLs by mouth every 4 (four) hours as needed. Cough and congestion      gabapentin 300 MG capsule   Commonly known as: NEURONTIN   Take 600 mg by mouth at bedtime.      glipiZIDE 10 MG tablet   Commonly known as: GLUCOTROL   Take 10 mg by mouth 2 (two) times daily before a meal.      hydrochlorothiazide 25 MG tablet   Commonly known as: HYDRODIURIL   Take 25 mg by mouth daily.      HYDROcodone-acetaminophen 5-325 MG per tablet    Commonly known as: NORCO   Take 1-2 tablets by mouth every 4 (four) hours as needed. pain      metFORMIN 500 MG 24 hr tablet   Commonly known as: GLUCOPHAGE-XR   Take 500 mg by mouth 2 (two) times daily.      metoprolol 100 MG tablet   Commonly known as: LOPRESSOR   Take 100 mg by mouth daily.      predniSONE 20 MG tablet   Commonly known as: DELTASONE     Take 3 tablets (60 mg total) by mouth daily before breakfast.           Medications Discontinued During This Encounter  Medication Reason  . dextromethorphan-guaiFENesin (ROBITUSSIN-DM) 10-100 MG/5ML liquid 10 mL Duplicate  . enoxaparin (LOVENOX) injection 40 mg   . enoxaparin (LOVENOX) injection 40 mg Dose change  . enoxaparin (LOVENOX) injection 40 mg Dose change  . methylPREDNISolone sodium succinate (SOLU-MEDROL) 125 MG injection 80 mg   . albuterol (PROVENTIL) (5 MG/ML) 0.5% nebulizer solution 2.5 mg     Signed: Brennyn Haisley,JAI 01/07/2012, 1:32 PM     

## 2012-01-15 ENCOUNTER — Encounter: Payer: Self-pay | Admitting: Family Medicine

## 2013-12-30 ENCOUNTER — Other Ambulatory Visit (HOSPITAL_COMMUNITY): Payer: Self-pay | Admitting: "Endocrinology

## 2013-12-30 DIAGNOSIS — E0590 Thyrotoxicosis, unspecified without thyrotoxic crisis or storm: Secondary | ICD-10-CM

## 2014-01-06 ENCOUNTER — Encounter (HOSPITAL_COMMUNITY): Payer: 59

## 2014-01-07 ENCOUNTER — Encounter (HOSPITAL_COMMUNITY): Payer: 59

## 2014-01-12 ENCOUNTER — Encounter (HOSPITAL_COMMUNITY)
Admission: RE | Admit: 2014-01-12 | Discharge: 2014-01-12 | Disposition: A | Discharge status: Home / Self Care | Payer: 59 | Source: Ambulatory Visit | Attending: "Endocrinology | Admitting: "Endocrinology

## 2014-01-12 ENCOUNTER — Encounter (HOSPITAL_COMMUNITY): Payer: Self-pay

## 2014-01-12 DIAGNOSIS — R5381 Other malaise: Secondary | ICD-10-CM | POA: Insufficient documentation

## 2014-01-12 DIAGNOSIS — E0590 Thyrotoxicosis, unspecified without thyrotoxic crisis or storm: Secondary | ICD-10-CM

## 2014-01-12 DIAGNOSIS — R5383 Other fatigue: Secondary | ICD-10-CM

## 2014-01-12 DIAGNOSIS — R634 Abnormal weight loss: Secondary | ICD-10-CM | POA: Insufficient documentation

## 2014-01-12 DIAGNOSIS — E049 Nontoxic goiter, unspecified: Secondary | ICD-10-CM | POA: Insufficient documentation

## 2014-01-12 HISTORY — DX: Unspecified asthma, uncomplicated: J45.909

## 2014-01-12 MED ORDER — SODIUM IODIDE I 131 CAPSULE
19.0000 | Freq: Once | INTRAVENOUS | Status: AC | PRN
  Administered 2014-01-12: 19 via ORAL

## 2014-01-13 ENCOUNTER — Encounter (HOSPITAL_COMMUNITY)
Admission: RE | Admit: 2014-01-13 | Discharge: 2014-01-13 | Disposition: A | Discharge status: Home / Self Care | Payer: 59 | Source: Ambulatory Visit | Attending: "Endocrinology | Admitting: "Endocrinology

## 2014-01-13 MED ORDER — SODIUM PERTECHNETATE TC 99M INJECTION
10.0000 | Freq: Once | INTRAVENOUS | Status: AC | PRN
  Administered 2014-01-13: 10 via INTRAVENOUS

## 2014-01-18 ENCOUNTER — Other Ambulatory Visit (HOSPITAL_COMMUNITY): Payer: Self-pay | Admitting: "Endocrinology

## 2014-01-18 DIAGNOSIS — E0590 Thyrotoxicosis, unspecified without thyrotoxic crisis or storm: Secondary | ICD-10-CM

## 2014-01-24 ENCOUNTER — Encounter (HOSPITAL_COMMUNITY)
Admission: RE | Admit: 2014-01-24 | Discharge: 2014-01-24 | Disposition: A | Discharge status: Home / Self Care | Payer: 59 | Source: Ambulatory Visit | Attending: "Endocrinology | Admitting: "Endocrinology

## 2014-01-24 DIAGNOSIS — E0590 Thyrotoxicosis, unspecified without thyrotoxic crisis or storm: Secondary | ICD-10-CM | POA: Insufficient documentation

## 2014-01-27 LAB — HCG, SERUM, QUALITATIVE: PREG SERUM: NEGATIVE

## 2014-01-28 ENCOUNTER — Encounter (HOSPITAL_COMMUNITY): Payer: Self-pay

## 2014-01-28 ENCOUNTER — Encounter (HOSPITAL_COMMUNITY)
Admission: RE | Admit: 2014-01-28 | Discharge: 2014-01-28 | Disposition: A | Discharge status: Home / Self Care | Payer: 59 | Source: Ambulatory Visit | Attending: "Endocrinology | Admitting: "Endocrinology

## 2014-01-28 DIAGNOSIS — E0590 Thyrotoxicosis, unspecified without thyrotoxic crisis or storm: Secondary | ICD-10-CM | POA: Insufficient documentation

## 2014-01-28 MED ORDER — SODIUM IODIDE I 131 CAPSULE
25.0000 | Freq: Once | INTRAVENOUS | Status: AC | PRN
  Administered 2014-01-28: 25 via ORAL

## 2014-03-01 ENCOUNTER — Other Ambulatory Visit (HOSPITAL_COMMUNITY): Payer: Self-pay

## 2014-03-01 DIAGNOSIS — G4730 Sleep apnea, unspecified: Secondary | ICD-10-CM

## 2014-03-06 ENCOUNTER — Ambulatory Visit: Payer: 59 | Attending: Neurology | Admitting: Sleep Medicine

## 2014-03-06 ENCOUNTER — Encounter: Payer: Self-pay | Admitting: Neurology

## 2014-03-06 DIAGNOSIS — G4730 Sleep apnea, unspecified: Secondary | ICD-10-CM | POA: Insufficient documentation

## 2014-03-18 NOTE — Sleep Study (Signed)
  [place] A. Merlene Laughter, MD     [email or web address]        NOCTURNAL POLYSOMNOGRAM    LOCATION: SLEEP LAB FACILITY: [facility]   PHYSICIAN: Roosevelt Eimers A. Merlene Laughter, M.D.   DATE OF STUDY:  03/06/2014.   REFERRING PHYSICIAN: Javion Holmer.  INDICATIONS: This is a 48 year old presents with snoring, insomnia and fatigue. Study is being done to evaluate for obstructive sleep apnea syndrome.  MEDICATIONS:  Prior to Admission medications   Medication Sig Start Date End Date Taking? Authorizing Provider  albuterol (PROVENTIL HFA;VENTOLIN HFA) 108 (90 BASE) MCG/ACT inhaler Inhale 2 puffs into the lungs every 4 (four) hours as needed for wheezing or shortness of breath. 01/07/12 01/06/13  Nita Sells, MD  citalopram (CELEXA) 20 MG tablet Take 20 mg by mouth daily.    Historical Provider, MD  dextromethorphan-guaiFENesin (ROBITUSSIN-DM) 10-100 MG/5ML liquid Take 10 mLs by mouth every 4 (four) hours as needed. Cough and congestion    Historical Provider, MD  gabapentin (NEURONTIN) 300 MG capsule Take 600 mg by mouth at bedtime.     Historical Provider, MD  glipiZIDE (GLUCOTROL) 10 MG tablet Take 10 mg by mouth 2 (two) times daily before a meal.    Historical Provider, MD  hydrochlorothiazide (HYDRODIURIL) 25 MG tablet Take 25 mg by mouth daily.    Historical Provider, MD  HYDROcodone-acetaminophen (NORCO) 5-325 MG per tablet Take 1-2 tablets by mouth every 4 (four) hours as needed. pain    Historical Provider, MD  metFORMIN (GLUCOPHAGE-XR) 500 MG 24 hr tablet Take 500 mg by mouth 2 (two) times daily.    Historical Provider, MD  metoprolol (LOPRESSOR) 100 MG tablet Take 100 mg by mouth daily.    Historical Provider, MD      EPWORTH SLEEPINESS SCALE: 2.   BMI: 54.   ARCHITECTURAL SUMMARY: Total recording time was 415 minutes. Sleep efficiency 50 %. Sleep latency 29 minutes. REM latency 371 minutes. Stage NI 10 %, N2 63 % and N3 20 % and REM sleep 7 %.    RESPIRATORY DATA:   Baseline oxygen saturation is 99 %. The lowest saturation is 86 %. The diagnostic AHI is 17. The RDI is 18. The REM AHI is 12.  LIMB MOVEMENT SUMMARY: PLM index 3.   ELECTROCARDIOGRAM SUMMARY: Average heart rate is 82 with no significant dysrhythmias observed.   IMPRESSION:  1. Moderate obstructive sleep apnea syndrome. A formal CPAP titration recording is suggested. 2. Abnormal sleep architecture with poor sleep efficiency.  Thanks for this referral.  Nita [place] A. Merlene Laughter, M.D. Diplomat, Tax adviser of Sleep Medicine.

## 2015-08-01 ENCOUNTER — Telehealth: Payer: Self-pay

## 2015-08-01 ENCOUNTER — Encounter: Payer: Self-pay | Admitting: Gastroenterology

## 2015-08-01 ENCOUNTER — Other Ambulatory Visit: Payer: Self-pay

## 2015-08-01 ENCOUNTER — Ambulatory Visit (INDEPENDENT_AMBULATORY_CARE_PROVIDER_SITE_OTHER): Payer: 59 | Admitting: Gastroenterology

## 2015-08-01 VITALS — BP 173/83 | HR 85 | Ht 67.0 in | Wt 376.4 lb

## 2015-08-01 DIAGNOSIS — R1900 Intra-abdominal and pelvic swelling, mass and lump, unspecified site: Secondary | ICD-10-CM | POA: Diagnosis not present

## 2015-08-01 DIAGNOSIS — R011 Cardiac murmur, unspecified: Secondary | ICD-10-CM | POA: Insufficient documentation

## 2015-08-01 DIAGNOSIS — R010 Benign and innocent cardiac murmurs: Secondary | ICD-10-CM | POA: Diagnosis not present

## 2015-08-01 DIAGNOSIS — D509 Iron deficiency anemia, unspecified: Secondary | ICD-10-CM | POA: Diagnosis not present

## 2015-08-01 NOTE — Assessment & Plan Note (Signed)
Abdominal exam with either large ventral hernia or mass. Difficult to discern due to large, obese abdomen, but does appear to have some type of abnormality on abdominal exam. Proceed with CT to rule out occult process prior to procedures.

## 2015-08-01 NOTE — Assessment & Plan Note (Signed)
49 year old female with impressive IDA (Hgb 7, ferritin6, iron 14), seen recently by Dr. Tressie Stalker with iron infusion and an additional infusion planned later this week, presenting with need for colonoscopy/EGD for further evaluation. No prior GI procedures. No overt GI bleeding. She does endorse history of NSAIDs for knee discomfort. Needs evaluation to rule out GI source for IDA.   Proceed with TCS/EGD with Dr. Gala Romney in near future: the risks, benefits, and alternatives have been discussed with the patient in detail. The patient states understanding and desires to proceed.

## 2015-08-01 NOTE — Progress Notes (Signed)
Primary Care Physician:  Glo Herring., MD Primary Gastroenterologist:  Dr. Gala Romney   Chief Complaint  Patient presents with  . EGD  . Colonoscopy    HPI:   Sharon Garcia is a 49 y.o. female presenting today at the request of  Dr. Gerarda Fraction secondary to anemia and need for GI evaluation. Outside labs reviewed with Hgb 8.5 in Aug 2016.   She has recently seen Dr. Tressie Stalker and received iron infusion last week. Will receive again this Thursday, Sept 22.  Dr. Tressie Stalker may have ordered additional iron studies, and I was able to access this through care everywhere. This showed ferritin 6 and iron 14. Hgb 7.6 at that time.  No hematochezia, no melena. No abdominal pain. No N/V. No reflux exacerbations. Monthly menstrual cycle, had heavy cycle 2 months ago. Usually very light. Has taken ibuprofen in the past for knee pain. Used to take daily. No aspirin powders. Baby aspirin daily. No prior colonoscopy. No prior upper EGD. Prilosec 40 mg daily.   Past Medical History  Diagnosis Date  . Hypertension   . Diabetes mellitus   . Bronchitis   . Asthma   . Hyperthyroidism     s/p radiation    Past Surgical History  Procedure Laterality Date  . Thyroid radiation      Current Outpatient Prescriptions  Medication Sig Dispense Refill  . citalopram (CELEXA) 20 MG tablet Take 20 mg by mouth daily.    Marland Kitchen diltiazem (DILACOR XR) 180 MG 24 hr capsule Take 180 mg by mouth daily.    . ferrous sulfate 325 (65 FE) MG tablet Take 325 mg by mouth daily with breakfast.    . hydrochlorothiazide (HYDRODIURIL) 25 MG tablet Take 25 mg by mouth daily.    Marland Kitchen levothyroxine (SYNTHROID, LEVOTHROID) 100 MCG tablet Take 100 mcg by mouth daily before breakfast.    . metFORMIN (GLUCOPHAGE-XR) 500 MG 24 hr tablet Take 500 mg by mouth 2 (two) times daily.    Marland Kitchen omeprazole (PRILOSEC) 40 MG capsule Take 40 mg by mouth daily.    . pioglitazone (ACTOS) 30 MG tablet Take 30 mg by mouth daily.    Marland Kitchen POTASSIUM PO Take by  mouth. Over the counter     No current facility-administered medications for this visit.    Allergies as of 08/01/2015  . (No Known Allergies)    Family History  Problem Relation Age of Onset  . Heart disease Father   . Kidney disease Mother   . Multiple myeloma Mother   . Diabetes Mother   . Diabetes Father   . Lung cancer Maternal Grandmother   . Colon cancer Neg Hx     Social History   Social History  . Marital Status: Married    Spouse Name: Juleen China  . Number of Children: 1  . Years of Education: 14   Occupational History  . 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 Smoking status: Former Smoker -- 0.30 packs/day for 10 years    Types: Cigarettes    Quit date: 01/05/2005  . Smokeless tobacco: Never Used  . Alcohol Use: No  . Drug Use: No  . Sexual Activity: Not on file   Other Topics Concern  . Not on file   Social History Narrative   Married.  Lives in [place] with husband.  Independent of ADLs.    Review of Systems: Gen: Denies any fever, chills, fatigue, weight loss, lack of appetite.  CV: Denies chest pain, heart palpitations, peripheral edema, syncope.  Resp: +DOE  GI: see HPI GU : Denies urinary burning, urinary frequency, urinary hesitancy MS: Denies joint pain, muscle weakness, cramps, or limitation of movement.  Derm: Denies rash, itching, dry skin Psych: Denies depression, anxiety, memory loss, and confusion Heme: see HPI  Physical Exam: BP 173/83 mmHg  Pulse 85  Ht 5' 7"  (1.702 m)  Wt 376 lb 6.4 oz (170.734 kg)  BMI 58.94 kg/m2  LMP 06/26/2015 General:   Alert and oriented. Pleasant and cooperative. Well-nourished and well-developed.  Head:  Normocephalic and atraumatic. Eyes:  Without icterus, sclera clear and conjunctiva pink.  Ears:  Normal auditory acuity. Nose:  No deformity, discharge,  or lesions. Mouth:  No deformity or lesions, oral mucosa pink.  Lungs:  Clear to auscultation bilaterally. No wheezes, rales,  or rhonchi. No distress.  Heart:  S1, S2 present with 3/6 systolic murmur appreciated Abdomen:  Largely obese, with question of ventral hernia vs mass mid abdomen. +BS. Unable to appreciate HSM due to large AP diameter Rectal:  Deferred  Extremities:  edematous legs, lymphedema  Neurologic:  Alert and  oriented x4;  grossly normal neurologically. Skin:  Intact without significant lesions or rashes. Psych:  Alert and cooperative. Normal mood and affect.

## 2015-08-01 NOTE — Telephone Encounter (Signed)
Called pt and LMOM to let her know about her CT scan appt. Pt is set for CT and Echo.  LMOM for pt to pick up contrast.   Per [employer]. PA # [phone] Expires 09/15/2015

## 2015-08-01 NOTE — Patient Instructions (Addendum)
I would like to get a CT scan and a cardiac echo before we do the colonoscopy/endoscopy.   INSTRUCTIONS FOR CT: Do not take metformin the day of the CT and continue to hold it for 72 hours after. THEN you may restart it.   We have also scheduled you for the colonoscopy and upper endoscopy with Dr. Gala Romney in the near future. You will stop your iron 7 days before. Take only 1/2 dose of oral diabetes medications the day before the procedure and none the day of.

## 2015-08-01 NOTE — Assessment & Plan Note (Signed)
Notable systolic murmur. Patient states she has never been diagnosed with this before or had any work-up. Proceed with echo. May need cardiology evaluation in near future.

## 2015-08-02 NOTE — Progress Notes (Signed)
CC'D TO PCP °

## 2015-08-08 ENCOUNTER — Telehealth: Payer: Self-pay | Admitting: "Endocrinology

## 2015-08-08 NOTE — Telephone Encounter (Signed)
The TV commercial will not apply to her. It is likely Invokana. If so, please advise her to continue to take it , call back if she ahs new problems mostly yeast infection.

## 2015-08-08 NOTE — Telephone Encounter (Signed)
Shawntee saw a commercial on TV saying that something Dr. Dorris Fetch prescribed her would cause major symptoms and side affects and she was scared to keep taking it. Please call her back and let her know if it's ok to still take. She didn't say the correct medicine she was asking about though.  [phone]

## 2015-08-09 ENCOUNTER — Ambulatory Visit (HOSPITAL_COMMUNITY)
Admission: RE | Admit: 2015-08-09 | Discharge: 2015-08-09 | Disposition: A | Discharge status: Home / Self Care | Payer: 59 | Source: Ambulatory Visit | Attending: Gastroenterology | Admitting: Gastroenterology

## 2015-08-09 ENCOUNTER — Ambulatory Visit (HOSPITAL_BASED_OUTPATIENT_CLINIC_OR_DEPARTMENT_OTHER)
Admission: RE | Admit: 2015-08-09 | Discharge: 2015-08-09 | Disposition: A | Discharge status: Home / Self Care | Payer: 59 | Source: Ambulatory Visit | Attending: Gastroenterology | Admitting: Gastroenterology

## 2015-08-09 DIAGNOSIS — R918 Other nonspecific abnormal finding of lung field: Secondary | ICD-10-CM | POA: Insufficient documentation

## 2015-08-09 DIAGNOSIS — I071 Rheumatic tricuspid insufficiency: Secondary | ICD-10-CM | POA: Insufficient documentation

## 2015-08-09 DIAGNOSIS — R010 Benign and innocent cardiac murmurs: Secondary | ICD-10-CM

## 2015-08-09 DIAGNOSIS — K429 Umbilical hernia without obstruction or gangrene: Secondary | ICD-10-CM | POA: Insufficient documentation

## 2015-08-09 DIAGNOSIS — R1900 Intra-abdominal and pelvic swelling, mass and lump, unspecified site: Secondary | ICD-10-CM | POA: Diagnosis present

## 2015-08-09 DIAGNOSIS — R011 Cardiac murmur, unspecified: Secondary | ICD-10-CM

## 2015-08-09 DIAGNOSIS — K760 Fatty (change of) liver, not elsewhere classified: Secondary | ICD-10-CM | POA: Diagnosis not present

## 2015-08-09 DIAGNOSIS — D3501 Benign neoplasm of right adrenal gland: Secondary | ICD-10-CM | POA: Diagnosis not present

## 2015-08-09 LAB — POCT I-STAT CREATININE: Creatinine, Ser: 0.9 mg/dL (ref 0.44–1.00)

## 2015-08-09 MED ORDER — IOHEXOL 300 MG/ML  SOLN
100.0000 mL | Freq: Once | INTRAMUSCULAR | Status: AC | PRN
  Administered 2015-08-09: 100 mL via INTRAVENOUS

## 2015-08-09 NOTE — Telephone Encounter (Signed)
Pt notified and agrees to continue taking Invokana.

## 2015-08-14 ENCOUNTER — Other Ambulatory Visit: Payer: Self-pay

## 2015-08-14 DIAGNOSIS — R011 Cardiac murmur, unspecified: Secondary | ICD-10-CM

## 2015-08-14 NOTE — Progress Notes (Signed)
Quick Note:  CT reviewed. She has an umbilical hernia. No mass. Good news.  Would repeat CHEST CT in 1 year due to small lung nodules.  ECHO has interesting finding. Please see that note. We need to have her see cardiology before colonoscopy/EGD. ______

## 2015-08-14 NOTE — Progress Notes (Signed)
Quick Note:  ECHO reviewed. She has heavy calcification of the posterior mitral annulus without obvious stenosis. Consideration raised for assessing for mycobacterium avium complex (MAC) with TEE or cardiac MRI. I would like her to see cardiology as soon as possible to be evaluated for best step in imaging. Once she is cleared from that standpoint, could proceed with TCS/EGD. ______

## 2015-08-15 ENCOUNTER — Telehealth: Payer: Self-pay | Admitting: Gastroenterology

## 2015-08-15 NOTE — Telephone Encounter (Signed)
Pt called and states that she is set to see her cardiologist on 10/20/20160 @ 3:15pm.  Pt is set for a NO charge OV per AS on 09/13/2015 @ 0900  Called pt and LMOM

## 2015-08-15 NOTE — Telephone Encounter (Signed)
Called pt and LMOM to call office back.   Spot held on schedule for 08/30/2015 @ 815

## 2015-08-15 NOTE — Telephone Encounter (Signed)
Received note from Dr. Tressie Stalker. Appears he is trying to arrange cardiology evaluation as well. Let's see if that has already been scheduled for her through [place]. She can do with [facility] if it is easier.   As for ECHO: I truly don't think she has MAC. However, I do agree with Dr. Tressie Stalker that this needs evaluation ASAP.   Let's go ahead and put her on the schedule for a TCS/EGD with Dr. Gala Romney in next 2 weeks. Hopefully, her cardiology evaluation will be complete at that point. We need to make sure she sees cardiology and has clearance from them before proceeding. I am sure it will be fine, but we need that.

## 2015-08-15 NOTE — Telephone Encounter (Signed)
Let's get her on the schedule tentatively that week if possible, shortly after my visit with her. Same week if possible. I don't want her waiting too long.

## 2015-08-29 ENCOUNTER — Other Ambulatory Visit: Payer: Self-pay | Admitting: "Endocrinology

## 2015-09-13 ENCOUNTER — Ambulatory Visit: Payer: 59 | Admitting: Gastroenterology

## 2015-09-13 ENCOUNTER — Encounter: Payer: Self-pay | Admitting: Gastroenterology

## 2015-09-13 ENCOUNTER — Telehealth: Payer: Self-pay | Admitting: Gastroenterology

## 2015-09-13 NOTE — Telephone Encounter (Signed)
PATIENT WAS A NO SHOW AND LETTER SENT  °

## 2015-09-13 NOTE — Telephone Encounter (Signed)
Ok. Let's take off the schedule if she is on it.

## 2015-09-13 NOTE — Telephone Encounter (Signed)
Pt was a no call no show today for OV.

## 2015-09-13 NOTE — Telephone Encounter (Signed)
done

## 2015-10-04 ENCOUNTER — Other Ambulatory Visit: Payer: Self-pay

## 2015-10-04 MED ORDER — CANAGLIFLOZIN 100 MG PO TABS: 100.0000 mg | ORAL_TABLET | Freq: Every day | ORAL | Status: DC

## 2015-10-04 MED ORDER — LEVOTHYROXINE SODIUM 125 MCG PO TABS: 125.0000 ug | ORAL_TABLET | Freq: Every day | ORAL | Status: DC

## 2015-11-02 ENCOUNTER — Ambulatory Visit (INDEPENDENT_AMBULATORY_CARE_PROVIDER_SITE_OTHER): Payer: 59 | Admitting: "Endocrinology

## 2015-11-02 ENCOUNTER — Encounter: Payer: Self-pay | Admitting: "Endocrinology

## 2015-11-02 VITALS — BP 143/88 | HR 85 | Ht 67.0 in | Wt 386.0 lb

## 2015-11-02 DIAGNOSIS — E032 Hypothyroidism due to medicaments and other exogenous substances: Secondary | ICD-10-CM

## 2015-11-02 DIAGNOSIS — E890 Postprocedural hypothyroidism: Secondary | ICD-10-CM | POA: Insufficient documentation

## 2015-11-02 DIAGNOSIS — I10 Essential (primary) hypertension: Secondary | ICD-10-CM

## 2015-11-02 DIAGNOSIS — E1165 Type 2 diabetes mellitus with hyperglycemia: Secondary | ICD-10-CM | POA: Diagnosis not present

## 2015-11-02 DIAGNOSIS — IMO0001 Reserved for inherently not codable concepts without codable children: Secondary | ICD-10-CM

## 2015-11-02 MED ORDER — METFORMIN HCL ER 500 MG PO TB24: 500.0000 mg | ORAL_TABLET | Freq: Two times a day (BID) | ORAL | Status: DC

## 2015-11-02 NOTE — Patient Instructions (Signed)

## 2015-11-04 NOTE — Progress Notes (Signed)
Subjective:    Patient ID: Sharon Garcia, female    DOB: Oct 22, 1966, PCP Glo Herring., MD   Past Medical History  Diagnosis Date  . Hypertension   . Diabetes mellitus   . Bronchitis   . Asthma   . Hyperthyroidism     s/p radiation   Past Surgical History  Procedure Laterality Date  . Thyroid radiation     Social History   Social History  . Marital Status: Married    Spouse Name: Juleen China  . Number of Children: 1  . Years of Education: 14   Occupational History  . 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 Smoking status: Former Smoker -- 0.30 packs/day for 10 years    Types: Cigarettes    Quit date: 01/05/2005  . Smokeless tobacco: Never Used  . Alcohol Use: No  . Drug Use: No  . Sexual Activity: Not Asked   Other Topics Concern  . None   Social History Narrative   Married.  Lives in [place] with husband.  Independent of ADLs.   Outpatient Encounter Prescriptions as of 11/02/2015  Medication Sig  . canagliflozin (INVOKANA) 100 MG TABS tablet Take 1 tablet (100 mg total) by mouth daily before breakfast.  . citalopram (CELEXA) 20 MG tablet Take 20 mg by mouth daily.  Marland Kitchen diltiazem (DILACOR XR) 180 MG 24 hr capsule Take 180 mg by mouth daily.  . ferrous sulfate 325 (65 FE) MG tablet Take 325 mg by mouth daily with breakfast.  . hydrochlorothiazide (HYDRODIURIL) 25 MG tablet Take 25 mg by mouth daily.  Marland Kitchen levothyroxine (SYNTHROID, LEVOTHROID) 125 MCG tablet Take 1 tablet (125 mcg total) by mouth daily.  . metFORMIN (GLUCOPHAGE-XR) 500 MG 24 hr tablet Take 1 tablet (500 mg total) by mouth 2 (two) times daily.  Marland Kitchen omeprazole (PRILOSEC) 40 MG capsule Take 40 mg by mouth daily.  Marland Kitchen POTASSIUM PO Take by mouth. Over the counter  . [DISCONTINUED] metFORMIN (GLUCOPHAGE-XR) 500 MG 24 hr tablet Take 500 mg by mouth 2 (two) times daily.  . [DISCONTINUED] pioglitazone (ACTOS) 30 MG tablet Take 30 mg by mouth daily.   No facility-administered encounter  medications on file as of 11/02/2015.   ALLERGIES: No Known Allergies VACCINATION STATUS:  There is no immunization history on file for this patient.  HPI  Mrs. Sharon Garcia is a 49 - yr-old patient with medical history as above. She is here to f/u for RAI hypothyroidism , type 2 DM, HTN,  Obesity.  she is on Lt4 125 mcg po qam. She is compliant. She stopped the Actos from last visit. For her diabetes she is taking metformin and Invokana . She regained lost the 37 lbs overall. Pt denies family history of thyroid dysfunction . She denies personal history of goiter.  Review of Systems  Constitutional: No weight change, no fatigue, no subjective hyperthermia/hypothermia Eyes: no blurry vision, no xerophthalmia ENT: no sore throat, no nodules palpated in throat, no dysphagia/odynophagia, no hoarseness Cardiovascular: no CP/SOB/palpitations/leg swelling Respiratory: no cough/SOB Gastrointestinal: no N/V/D/C Musculoskeletal: She now uses crutches due to large extremities from lymphedema. Skin: no rashes Neurological: no tremors/numbness/tingling/dizziness Psychiatric: no depression/anxiety  Objective:    BP 143/88 mmHg  Pulse 85  Ht 5\' 7"  (1.702 m)  Wt 386 lb (175.088 kg)  BMI 60.44 kg/m2  SpO2 100%  Wt Readings from Last 3 Encounters:  11/02/15 386 lb (175.088 kg)  08/01/15 376 lb 6.4 oz (170.734 kg)  01/06/12 360  lb (163.295 kg)    Physical Exam  Constitutional: obese ,  in NAD. Walks with crutches. Eyes: PERRLA, EOMI, no exophthalmos ENT: moist mucous membranes, no thyromegaly, no cervical lymphadenopathy Cardiovascular: RRR, No MRG Respiratory: CTA B Gastrointestinal: abdomen soft, NT, ND, BS+ Musculoskeletal: She has bilateral large extremities due to lymphedema., No focal deficits..  Skin: moist, warm, no rashes Neurological: no tremor with outstretched hands.   CMP ( most recent) CMP     Component Value Date/Time   NA 135 01/07/2012 0326   K 3.6 01/07/2012  0326   CL 93* 01/07/2012 0326   CO2 26 01/07/2012 0326   GLUCOSE 344* 01/07/2012 0326   BUN 15 01/07/2012 0326   CREATININE 0.90 08/09/2015 1307   CALCIUM 10.3 01/07/2012 0326   PROT 7.9 01/06/2012 1545   ALBUMIN 3.8 01/06/2012 1545   AST 25 01/06/2012 1545   ALT 17 01/06/2012 1545   ALKPHOS 60 01/06/2012 1545   BILITOT 0.5 01/06/2012 1545   GFRNONAA >90 01/07/2012 0326   GFRAA >90 01/07/2012 0326    Diabetic Labs (most recent): Lab Results  Component Value Date   HGBA1C 7.6* 01/06/2012     Assessment & Plan:   1.  RAI induced Hypothyroidism   -She is clinically euthyroid. I will continue the rocks in 125 g by mouth every morning.  - We discussed about correct intake of levothyroxine, at fasting, with water, separated by at least 30 minutes from breakfast, and separated by more than 4 hours from calcium, iron, multivitamins, acid reflux medications (PPIs). -Patient is made aware of the fact that thyroid hormone replacement is needed for life, dose to be adjusted by periodic monitoring of thyroid function tests.   2. Uncontrolled type 2 diabetes mellitus   -Her A1c is better at 7.6%. -Continue metformin 1000 mg by mouth twice a day, and Invokana 100 mg by mouth once a day. I will arrange for consult with the CDE.  3. Essential hypertension -Uncontrolled however better than last visit. Continue hydrochlorothiazide 25 mg by mouth daily and guilty has been 180 mg XR by mouth daily.  4. Morbid obesity due to excess calories [facility]) -This is a chronic problem for her. I gave her detailed carbohydrates information. She has physical limitations for optimal exercise. I will send her to a dietitian for consult.  - 25 minutes of time was spent on the care of this patient , 50% of which was applied for counseling on diabetes complications and their preventions.  - I advised patient to maintain close follow up with Glo Herring., MD for primary care needs. Follow up plan: Return  in about 6 months (around 05/02/2016) for diabetes, high blood pressure, underactive thyroid.  Glade Lloyd, MD Phone: [phone]  Fax: [phone]   11/04/2015, 11:36 AM

## 2015-11-16 NOTE — Telephone Encounter (Signed)
Can we reach out to patient and arrange a follow-up for her?

## 2015-11-17 ENCOUNTER — Encounter: Payer: Self-pay | Admitting: Internal Medicine

## 2015-11-17 NOTE — Telephone Encounter (Signed)
MADE ANOTHER APPOINTMENT AND MAILED LETTER

## 2015-11-22 ENCOUNTER — Other Ambulatory Visit: Payer: Self-pay | Admitting: "Endocrinology

## 2015-11-30 ENCOUNTER — Other Ambulatory Visit: Payer: Self-pay

## 2015-11-30 MED ORDER — FLUCONAZOLE 150 MG PO TABS: 150.0000 mg | ORAL_TABLET | Freq: Once | ORAL | Status: DC

## 2015-11-30 NOTE — Telephone Encounter (Signed)
Pt is requesting a prescription for Diflucan. She states that she has a yeast infection from the [place].

## 2015-12-06 ENCOUNTER — Ambulatory Visit: Payer: 59 | Admitting: Gastroenterology

## 2015-12-06 ENCOUNTER — Encounter: Payer: Self-pay | Admitting: Gastroenterology

## 2015-12-06 ENCOUNTER — Telehealth: Payer: Self-pay | Admitting: Gastroenterology

## 2015-12-06 NOTE — Telephone Encounter (Signed)
PATIENT WAS A NO SHOW AND LETTER SENT  °

## 2015-12-18 ENCOUNTER — Other Ambulatory Visit (HOSPITAL_COMMUNITY): Payer: Self-pay | Admitting: Podiatry

## 2015-12-18 ENCOUNTER — Ambulatory Visit (HOSPITAL_COMMUNITY)
Admission: RE | Admit: 2015-12-18 | Discharge: 2015-12-18 | Disposition: A | Discharge status: Home / Self Care | Payer: 59 | Source: Ambulatory Visit | Attending: Podiatry | Admitting: Podiatry

## 2015-12-18 DIAGNOSIS — L97529 Non-pressure chronic ulcer of other part of left foot with unspecified severity: Secondary | ICD-10-CM

## 2016-02-13 ENCOUNTER — Other Ambulatory Visit: Payer: Self-pay | Admitting: "Endocrinology

## 2016-04-07 ENCOUNTER — Other Ambulatory Visit: Payer: Self-pay | Admitting: "Endocrinology

## 2016-04-10 ENCOUNTER — Other Ambulatory Visit: Payer: Self-pay | Admitting: "Endocrinology

## 2016-04-19 ENCOUNTER — Other Ambulatory Visit: Payer: Self-pay | Admitting: "Endocrinology

## 2016-04-19 LAB — BASIC METABOLIC PANEL
BUN: 11 mg/dL (ref 7–25)
CALCIUM: 9.7 mg/dL (ref 8.6–10.4)
CHLORIDE: 101 mmol/L (ref 98–110)
CO2: 22 mmol/L (ref 20–31)
CREATININE: 0.79 mg/dL (ref 0.50–1.05)
Glucose, Bld: 227 mg/dL — ABNORMAL HIGH (ref 65–99)
Potassium: 4.1 mmol/L (ref 3.5–5.3)
Sodium: 138 mmol/L (ref 135–146)

## 2016-04-19 LAB — HEMOGLOBIN A1C
Hgb A1c MFr Bld: 7.1 % — ABNORMAL HIGH (ref <5.7)
Mean Plasma Glucose: 157 mg/dL

## 2016-04-20 LAB — T4, FREE: Free T4: 1.3 ng/dL (ref 0.8–1.8)

## 2016-04-20 LAB — TSH: TSH: 1.82 m[IU]/L

## 2016-05-09 ENCOUNTER — Encounter: Payer: Self-pay | Admitting: "Endocrinology

## 2016-05-09 ENCOUNTER — Ambulatory Visit (INDEPENDENT_AMBULATORY_CARE_PROVIDER_SITE_OTHER): Payer: 59 | Admitting: "Endocrinology

## 2016-05-09 VITALS — BP 143/73 | HR 90 | Ht 67.0 in | Wt 384.0 lb

## 2016-05-09 DIAGNOSIS — I10 Essential (primary) hypertension: Secondary | ICD-10-CM | POA: Diagnosis not present

## 2016-05-09 DIAGNOSIS — IMO0001 Reserved for inherently not codable concepts without codable children: Secondary | ICD-10-CM

## 2016-05-09 DIAGNOSIS — E1165 Type 2 diabetes mellitus with hyperglycemia: Secondary | ICD-10-CM | POA: Diagnosis not present

## 2016-05-09 DIAGNOSIS — E890 Postprocedural hypothyroidism: Secondary | ICD-10-CM | POA: Diagnosis not present

## 2016-05-09 NOTE — Progress Notes (Signed)
Subjective:    Patient ID: Sharon Garcia, female    DOB: May 30, 1966, PCP Sharon Garcia., MD   Past Medical History  Diagnosis Date  . Hypertension   . Diabetes mellitus   . Bronchitis   . Asthma   . Hyperthyroidism     s/p radiation   Past Surgical History  Procedure Laterality Date  . Thyroid radiation     Social History   Social History  . Marital Status: Married    Spouse Name: Sharon Garcia  . Number of Children: 1  . Years of Education: 14   Occupational History  . 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 Smoking status: Former Smoker -- 0.30 packs/day for 10 years    Types: Cigarettes    Quit date: 01/05/2005  . Smokeless tobacco: Never Used  . Alcohol Use: No  . Drug Use: No  . Sexual Activity: Not on file   Other Topics Concern  . Not on file   Social History Narrative   Married.  Lives in [place] with husband.  Independent of ADLs.   Outpatient Encounter Prescriptions as of 05/09/2016  Medication Sig  . citalopram (CELEXA) 20 MG tablet Take 20 mg by mouth daily.  Marland Kitchen diltiazem (DILACOR XR) 180 MG 24 hr capsule Take 180 mg by mouth daily.  . ferrous sulfate 325 (65 FE) MG tablet Take 325 mg by mouth daily with breakfast.  . fluconazole (DIFLUCAN) 150 MG tablet Take 1 tablet (150 mg total) by mouth once.  . hydrochlorothiazide (HYDRODIURIL) 25 MG tablet Take 25 mg by mouth daily.  . INVOKANA 100 MG TABS tablet Take 1 tablet by mouth  daily before breakfast  . levothyroxine (SYNTHROID, LEVOTHROID) 125 MCG tablet Take 1 tablet by mouth  daily  . metFORMIN (GLUCOPHAGE-XR) 500 MG 24 hr tablet Take 1 tablet by mouth two  times daily  . omeprazole (PRILOSEC) 40 MG capsule Take 40 mg by mouth daily.  Marland Kitchen POTASSIUM PO Take by mouth. Over the counter   No facility-administered encounter medications on file as of 05/09/2016.   ALLERGIES: No Known Allergies VACCINATION STATUS:  There is no immunization history on file for this  patient.  HPI  [name] is a 50 - yr-old patient with medical history as above. She is here to f/u for RAI hypothyroidism , type 2 DM, HTN,  Obesity.  she is on Lt4 125 mcg po qam. She is compliant. She stopped the Actos from last visit. For her diabetes she is taking metformin and Invokana . She has  regained   37 lbs she lost during hyperthyroidism.  Pt denies family history of thyroid dysfunction . She denies personal history of goiter.  Review of Systems  Constitutional: No weight change, no fatigue, no subjective hyperthermia/hypothermia Eyes: no blurry vision, no xerophthalmia ENT: no sore throat, no nodules palpated in throat, no dysphagia/odynophagia, no hoarseness Cardiovascular: no CP/SOB/palpitations/leg swelling Respiratory: no cough/SOB Gastrointestinal: no N/V/D/C Musculoskeletal: She now uses crutches due to large extremities from lymphedema. Skin: no rashes Neurological: no tremors/numbness/tingling/dizziness Psychiatric: no depression/anxiety  Objective:    BP 143/73 mmHg  Pulse 90  Ht 5\' 7"  (1.702 m)  Wt 384 lb (174.181 kg)  BMI 60.13 kg/m2  SpO2 97%  Wt Readings from Last 3 Encounters:  05/09/16 384 lb (174.181 kg)  11/02/15 386 lb (175.088 kg)  08/01/15 376 lb 6.4 oz (170.734 kg)    Physical Exam  Constitutional: obese ,  in NAD. Walks  with crutches. Eyes: PERRLA, EOMI, no exophthalmos ENT: moist mucous membranes, no thyromegaly, no cervical lymphadenopathy Cardiovascular: RRR, No MRG Respiratory: CTA B Gastrointestinal: abdomen soft, NT, ND, BS+ Musculoskeletal: She has bilateral large extremities due to lymphedema., No focal deficits..  Skin: moist, warm, no rashes Neurological: no tremor with outstretched hands.   CMP ( most recent) CMP     Component Value Date/Time   NA 138 04/19/2016 1435   K 4.1 04/19/2016 1435   CL 101 04/19/2016 1435   CO2 22 04/19/2016 1435   GLUCOSE 227* 04/19/2016 1435   BUN 11 04/19/2016 1435    CREATININE 0.79 04/19/2016 1435   CREATININE 0.90 08/09/2015 1307   CALCIUM 9.7 04/19/2016 1435   PROT 7.9 01/06/2012 1545   ALBUMIN 3.8 01/06/2012 1545   AST 25 01/06/2012 1545   ALT 17 01/06/2012 1545   ALKPHOS 60 01/06/2012 1545   BILITOT 0.5 01/06/2012 1545   GFRNONAA >90 01/07/2012 0326   GFRAA >90 01/07/2012 0326    Diabetic Labs (most recent): Lab Results  Component Value Date   HGBA1C 7.1* 04/19/2016   HGBA1C 7.6* 01/06/2012    Results for Sharon Garcia, Sharon Garcia (MRN [id number]) as of 05/09/2016 16:00  Ref. Range 04/19/2016 14:40  TSH Latest Units: mIU/L 1.82  T4,Free(Direct) Latest Ref Range: 0.8-1.8 ng/dL 1.3   Assessment & Plan:   1.  RAI induced Hypothyroidism   - Her thyroid function tests are consistent with appropriate replacement. -  I will continue the rocks in 125 g by mouth every morning.  - We discussed about correct intake of levothyroxine, at fasting, with water, separated by at least 30 minutes from breakfast, and separated by more than 4 hours from calcium, iron, multivitamins, acid reflux medications (PPIs). -Patient is made aware of the fact that thyroid hormone replacement is needed for life, dose to be adjusted by periodic monitoring of thyroid function tests.   2. Uncontrolled type 2 diabetes mellitus   -Her A1c is better at 7.1%. -Continue metformin 1000 mg by mouth twice a day, and Invokana 100 mg by mouth once a day. I will arrange for consult with the CDE.  3. Essential hypertension -Uncontrolled however better than last visit. Continue hydrochlorothiazide 25 mg by mouth daily and guilty has been 180 mg XR by mouth daily.  4. Morbid obesity due to excess calories [facility]) -This is a chronic problem for her. I gave her detailed carbohydrates information. She has physical limitations for optimal exercise. I will send her to a dietitian for consult.  - 25 minutes of time was spent on the care of this patient , 50% of which was applied for counseling on  diabetes complications and their preventions.  - I advised patient to maintain close follow up with Sharon Garcia., MD for primary care needs. Follow up plan: Return in about 6 months (around 11/08/2016) for follow up with pre-visit labs.  Glade Lloyd, MD Phone: [phone]  Fax: [phone]   05/09/2016, 4:10 PM

## 2016-05-29 ENCOUNTER — Telehealth: Payer: Self-pay | Admitting: "Endocrinology

## 2016-05-29 ENCOUNTER — Other Ambulatory Visit: Payer: Self-pay | Admitting: "Endocrinology

## 2016-05-29 MED ORDER — FLUCONAZOLE 150 MG PO TABS: 150.0000 mg | ORAL_TABLET | Freq: Once | ORAL | Status: DC

## 2016-05-29 NOTE — Telephone Encounter (Signed)
Patient c/o yeast infection from taking Invokana. Please call in medication to [facility]

## 2016-05-31 ENCOUNTER — Other Ambulatory Visit: Payer: Self-pay

## 2016-05-31 ENCOUNTER — Telehealth: Payer: Self-pay | Admitting: Cardiovascular Disease

## 2016-05-31 MED ORDER — FLUCONAZOLE 150 MG PO TABS: 150.0000 mg | ORAL_TABLET | Freq: Once | ORAL | Status: DC

## 2016-05-31 NOTE — Telephone Encounter (Signed)
Medication refilled

## 2016-05-31 NOTE — Telephone Encounter (Signed)
needs [facility] [place]

## 2016-05-31 NOTE — Telephone Encounter (Signed)
Records received from [facility] for apt with Dr Oval Linsey on 07/02/16 Records given to [facility] (medical Records) CN

## 2016-06-27 ENCOUNTER — Other Ambulatory Visit: Payer: Self-pay

## 2016-06-27 MED ORDER — FLUCONAZOLE 150 MG PO TABS: 150.0000 mg | ORAL_TABLET | Freq: Once | ORAL | 1 refills | Status: AC

## 2016-07-02 ENCOUNTER — Encounter: Payer: Self-pay | Admitting: Cardiovascular Disease

## 2016-07-02 ENCOUNTER — Encounter (INDEPENDENT_AMBULATORY_CARE_PROVIDER_SITE_OTHER): Payer: Self-pay

## 2016-07-02 ENCOUNTER — Ambulatory Visit (INDEPENDENT_AMBULATORY_CARE_PROVIDER_SITE_OTHER): Payer: 59 | Admitting: Cardiovascular Disease

## 2016-07-02 VITALS — BP 157/78 | HR 79 | Ht 67.0 in | Wt 386.8 lb

## 2016-07-02 DIAGNOSIS — Z13220 Encounter for screening for lipoid disorders: Secondary | ICD-10-CM

## 2016-07-02 DIAGNOSIS — I10 Essential (primary) hypertension: Secondary | ICD-10-CM

## 2016-07-02 DIAGNOSIS — Z01818 Encounter for other preprocedural examination: Secondary | ICD-10-CM | POA: Diagnosis not present

## 2016-07-02 MED ORDER — HYDROCHLOROTHIAZIDE 25 MG PO TABS: 25.0000 mg | ORAL_TABLET | Freq: Every day | ORAL | 1 refills | Status: DC

## 2016-07-02 NOTE — Patient Instructions (Addendum)
Medication Instructions:  START HYDROCHLOROTHIAZIDE 25 MG DAILY  Labwork: FASTING LIPID SOON   Testing/Procedures: Your physician has requested that you have an exercise tolerance test. For further information please visit [email or web address]. Please also follow instruction sheet, as given. DO NOT TAKE YOUR DILTIAZEM THE NIGHT BEFORE OR THE MORNING OF YOU ARE SCHEDULED AT  [facility] Friday 07/12/16  ARRIVE AT 9:15 AM AT THE MAIN ENTRANCE   Follow-Up: Your physician recommends that you schedule a follow-up appointment in: 1 month ov  If you need a refill on your cardiac medications before your next appointment, please call your pharmacy.

## 2016-07-02 NOTE — Progress Notes (Signed)
Cardiology Office Note   Date:  07/02/2016   ID:  Sharon Garcia, DOB Jul 01, 1966, MRN [id number]  PCP:  Glo Herring., MD  Cardiologist:   Skeet Latch, MD   Chief Complaint  Patient presents with  . New Patient (Initial Visit)    cramping and stabbign pain in legs occasionally. scrawling sensetion when standing up in left leg .edema; lymphedema. [place]      History of Present Illness: Sharon Garcia is a 50 y.o. female with paroxysmal atrial fibrillation, hypertension, lymphedema, diabetes, hyperthyroidism and morbid obesity who presents for evaluation of leg cramping and lower extremity edema.  Sharon Garcia was diagnosed with atrial fibrillation 12/2013.  At the time she was anemic and had thyrotoxicosis.  She has not had any recurrent episodes of atrial fibrillation and therefore was not ever started on anticoagulation.  Overall Sharon Garcia has been feeling well.  She denies chest pain but does note shortness of breath when walking short distances.  She is unable to walk 4 blocks or up a flight of stairs due to both dyspnea and knee pain.  She has lymphedema that has been worse lately.  She denies orthopnea or PND.  She previously went to therapy and used a machine for the lymphedema but stopped using it because it caused pain in her left lower leg.  Sharon Garcia reports gaining 50 lb in the last 6 months.  When she was first diagnosed with hyperthyroidism in 2015 she lost 80 lb.  However, lately she has been gaining this weight back.  She plans to have gastric bypass surgery in either December or January.   Past Medical History:  Diagnosis Date  . Asthma   . Bronchitis   . Diabetes mellitus   . Hyperlipidemia   . Hypertension   . Hyperthyroidism    s/p radiation  . Lymphedema     Past Surgical History:  Procedure Laterality Date  . thyroid radiation       Current Outpatient Prescriptions  Medication Sig Dispense Refill  . citalopram (CELEXA) 20 MG tablet Take 20 mg by  mouth daily.    Marland Kitchen diltiazem (DILACOR XR) 180 MG 24 hr capsule Take 180 mg by mouth daily.    . INVOKANA 100 MG TABS tablet Take 1 tablet by mouth  daily before breakfast 90 tablet 0  . levothyroxine (SYNTHROID, LEVOTHROID) 125 MCG tablet Take 1 tablet by mouth  daily 90 tablet 1  . metFORMIN (GLUCOPHAGE-XR) 500 MG 24 hr tablet Take 1 tablet by mouth two  times daily 180 tablet 0  . omeprazole (PRILOSEC) 40 MG capsule Take 40 mg by mouth daily.    Marland Kitchen POTASSIUM PO Take by mouth. Over the counter    . hydrochlorothiazide (HYDRODIURIL) 25 MG tablet Take 1 tablet (25 mg total) by mouth daily. 90 tablet 1   No current facility-administered medications for this visit.     Allergies:   Review of patient's allergies indicates no known allergies.    Social History:  The patient  reports that she quit smoking about 11 years ago. Her smoking use included Cigarettes. She has a 3.00 pack-year smoking history. She has never used smokeless tobacco. She reports that she does not drink alcohol or use drugs.   Family History:  The patient's family history includes Diabetes in her father and mother; Heart attack in her father and maternal grandfather; Kidney disease in her mother; Lung cancer in her maternal grandmother; Multiple myeloma in her mother.  ROS:  Please see the history of present illness.   Otherwise, review of systems are positive for none.   All other systems are reviewed and negative.    PHYSICAL EXAM: VS:  BP (!) 157/78   Pulse 79   Ht _0  (1.702 m)   Wt (!) 386 lb 12.8 oz (175.5 kg)   BMI 60.58 kg/m  , BMI Body mass index is 60.58 kg/m. GENERAL:  Well appearing HEENT:  Pupils equal round and reactive, fundi not visualized, oral mucosa unremarkable NECK:  No jugular venous distention, waveform within normal limits, carotid upstroke brisk and symmetric, no bruits, no thyromegaly LYMPHATICS:  No cervical adenopathy LUNGS:  Clear to auscultation bilaterally HEART:  RRR.  PMI not  displaced or sustained,S1 and S2 within normal limits, no S3, no S4, no clicks, no rubs, no murmurs ABD:  Flat, positive bowel sounds normal in frequency in pitch, no bruits, no rebound, no guarding, no midline pulsatile mass, no hepatomegaly, no splenomegaly EXT:  2 plus pulses throughout, no cyanosis no clubbing.  Bilateral lymphedema with minimal pitting edema SKIN:  No rashes no nodules NEURO:  Cranial nerves II through XII grossly intact, motor grossly intact throughout PSYCH:  Cognitively intact, oriented to person place and time   EKG:  EKG is ordered today. The ekg ordered today demonstrates sinus rhythm. Rate 79 bpm. Low voltage.   Recent Labs: 04/19/2016: BUN 11; Creat 0.79; Potassium 4.1; Sodium 138; TSH 1.82    Lipid Panel No results found for: CHOL, TRIG, HDL, CHOLHDL, VLDL, LDLCALC, LDLDIRECT    Wt Readings from Last 3 Encounters:  07/02/16 (!) 386 lb 12.8 oz (175.5 kg)  05/09/16 (!) 384 lb (174.2 kg)  11/02/15 (!) 386 lb (175.1 kg)      ASSESSMENT AND PLAN:  # Pre-surgical risk assessment:  Sharon Garcia is unable to achieve 4 METS without chest pain or shortness of breath.  Therefore she requires stress testing prior to surgery.  She is over the weight limit for nuclear stress testing.  She thinks that she could walk on the treadmill.  We will ask that she holds her diltiazem the night before and the day of her ETT.  # Hypertension: Ms. Moor' blood pressure is poorly-controlled.  On reviewing her medications, he has not been taking hydrochlorothiazide for the last year. She thought that it was included in one of her diabetes medications. We will restart hydrochlorothiazide 25 mg daily. This should help with both her blood pressure and her edema.  # CV Disease Prevention: Sharon Garcia is diabetic and is not on a statin. We will check fasting lipids when she comes for ETT.  Current medicines are reviewed at length with the patient today.  The patient does not have concerns  regarding medicines.  The following changes have been made:  Restart HCTZ 25 mg daily  Labs/ tests ordered today include:   Orders Placed This Encounter  Procedures  . Lipid panel  . Exercise Tolerance Test  . EKG 12-Lead     Disposition:   FU with Tavi Gaughran C. Oval Linsey, MD, [facility] in 1 month.    This note was written with the assistance of speech recognition software.  Please excuse any transcriptional errors.  Signed, Eithan Beagle C. Oval Linsey, MD, [facility]  07/02/2016 3:56 PM    [place]

## 2016-07-10 ENCOUNTER — Other Ambulatory Visit: Payer: Self-pay

## 2016-07-10 ENCOUNTER — Telehealth: Payer: Self-pay | Admitting: Internal Medicine

## 2016-07-10 ENCOUNTER — Encounter: Payer: Self-pay | Admitting: Internal Medicine

## 2016-07-10 DIAGNOSIS — R911 Solitary pulmonary nodule: Secondary | ICD-10-CM

## 2016-07-10 NOTE — Telephone Encounter (Signed)
Orders in and letter mailed 

## 2016-07-10 NOTE — Telephone Encounter (Signed)
Pt is one Oct recall list to have a repeat chest CT one year follow up

## 2016-07-11 ENCOUNTER — Other Ambulatory Visit: Payer: Self-pay | Admitting: "Endocrinology

## 2016-07-12 ENCOUNTER — Inpatient Hospital Stay (HOSPITAL_COMMUNITY): Admission: RE | Admit: 2016-07-12 | Payer: 59 | Source: Ambulatory Visit

## 2016-07-18 ENCOUNTER — Telehealth: Payer: Self-pay | Admitting: Cardiovascular Disease

## 2016-07-18 DIAGNOSIS — I10 Essential (primary) hypertension: Secondary | ICD-10-CM

## 2016-07-18 DIAGNOSIS — Z01818 Encounter for other preprocedural examination: Secondary | ICD-10-CM

## 2016-07-18 NOTE — Telephone Encounter (Signed)
New Message  Pt voiced shes calling to let MD Oval Linsey and nurse know she cancelled treadmill test due to her falling and she wouldn't be able to do it anytime soon.  Please follow up. Thanks!

## 2016-07-19 NOTE — Telephone Encounter (Signed)
Left message to call back  

## 2016-07-19 NOTE — Telephone Encounter (Signed)
Please set up for [facility]

## 2016-07-22 ENCOUNTER — Telehealth: Payer: Self-pay

## 2016-07-22 NOTE — Telephone Encounter (Signed)
Pt on recall list CT Abd/pelvis with contrast. Insurance is requiring a peer-to-peer. Information in AB chair. Pt is scheduled for CT 08/14/16.

## 2016-07-23 NOTE — Telephone Encounter (Signed)
Follow up  Pt voiced she's returning Melinda's call.  Please f/u with pt

## 2016-07-23 NOTE — Telephone Encounter (Signed)
Advised patient and will send to scheduling department

## 2016-07-24 NOTE — Telephone Encounter (Signed)
She actually needs CT CHEST only due to lung nodules seen on imaging in Sept 2016. She does not need CT abd/pelvis. Let's resubmit and send that info and see what they say.

## 2016-07-25 ENCOUNTER — Other Ambulatory Visit: Payer: Self-pay | Admitting: "Endocrinology

## 2016-07-25 ENCOUNTER — Telehealth: Payer: Self-pay | Admitting: "Endocrinology

## 2016-07-25 MED ORDER — FLUCONAZOLE 150 MG PO TABS: 150.0000 mg | ORAL_TABLET | Freq: Once | ORAL | 0 refills | Status: DC

## 2016-07-25 MED ORDER — FLUCONAZOLE 150 MG PO TABS: 150.0000 mg | ORAL_TABLET | Freq: Once | ORAL | 0 refills | Status: AC

## 2016-07-25 NOTE — Telephone Encounter (Signed)
Patient calling because she has another yeast infection. She is wondering if maybe she can come off the [place]? Please call.

## 2016-07-25 NOTE — Telephone Encounter (Signed)
I will send a prescription for yeast infection. Please call and inform patient. She does not have to stop Invokana.

## 2016-07-26 NOTE — Telephone Encounter (Signed)
LMOM for pt to call re: disregard letter that was mailed informing of CT abd/pelvis. Need to schedule different CT.

## 2016-07-29 NOTE — Telephone Encounter (Signed)
Cancelled CT Abd/pelvis that was scheduled for 08/14/16 per AB.

## 2016-07-30 ENCOUNTER — Other Ambulatory Visit: Payer: Self-pay

## 2016-07-30 NOTE — Telephone Encounter (Signed)
CT chest with contrast;

## 2016-07-30 NOTE — Telephone Encounter (Signed)
Do you want CT Chest to be done with or without contrast?

## 2016-07-31 ENCOUNTER — Other Ambulatory Visit: Payer: Self-pay

## 2016-07-31 ENCOUNTER — Other Ambulatory Visit: Payer: Self-pay | Admitting: "Endocrinology

## 2016-07-31 DIAGNOSIS — R918 Other nonspecific abnormal finding of lung field: Secondary | ICD-10-CM

## 2016-07-31 NOTE — Telephone Encounter (Signed)
CT Chest with contrast scheduled for 08/30/16 at 3:00 pm. Pt aware of appt and instructions. Letter also mailed.

## 2016-08-01 ENCOUNTER — Other Ambulatory Visit (HOSPITAL_COMMUNITY): Payer: Self-pay | Admitting: Family Medicine

## 2016-08-01 ENCOUNTER — Telehealth (HOSPITAL_COMMUNITY): Payer: Self-pay

## 2016-08-01 DIAGNOSIS — M25561 Pain in right knee: Secondary | ICD-10-CM

## 2016-08-01 NOTE — Telephone Encounter (Signed)
Encounter complete. 

## 2016-08-05 ENCOUNTER — Ambulatory Visit: Payer: 59 | Admitting: Cardiovascular Disease

## 2016-08-06 ENCOUNTER — Inpatient Hospital Stay (HOSPITAL_COMMUNITY): Admission: RE | Admit: 2016-08-06 | Payer: 59 | Source: Ambulatory Visit

## 2016-08-07 ENCOUNTER — Ambulatory Visit (HOSPITAL_COMMUNITY): Payer: 59

## 2016-08-08 ENCOUNTER — Other Ambulatory Visit (HOSPITAL_COMMUNITY): Payer: Self-pay | Admitting: Family Medicine

## 2016-08-08 DIAGNOSIS — M25561 Pain in right knee: Secondary | ICD-10-CM

## 2016-08-09 ENCOUNTER — Encounter: Payer: Self-pay | Admitting: *Deleted

## 2016-08-09 ENCOUNTER — Ambulatory Visit: Payer: 59 | Admitting: Cardiovascular Disease

## 2016-08-13 ENCOUNTER — Ambulatory Visit (HOSPITAL_COMMUNITY)
Admission: RE | Admit: 2016-08-13 | Discharge: 2016-08-13 | Disposition: A | Discharge status: Home / Self Care | Payer: 59 | Source: Ambulatory Visit | Attending: Family Medicine | Admitting: Family Medicine

## 2016-08-13 DIAGNOSIS — M25861 Other specified joint disorders, right knee: Secondary | ICD-10-CM | POA: Diagnosis not present

## 2016-08-13 DIAGNOSIS — M25561 Pain in right knee: Secondary | ICD-10-CM | POA: Diagnosis present

## 2016-08-13 DIAGNOSIS — M1711 Unilateral primary osteoarthritis, right knee: Secondary | ICD-10-CM | POA: Insufficient documentation

## 2016-08-14 ENCOUNTER — Ambulatory Visit (HOSPITAL_COMMUNITY): Payer: 59

## 2016-08-15 ENCOUNTER — Ambulatory Visit (HOSPITAL_COMMUNITY): Admission: RE | Admit: 2016-08-15 | Payer: 59 | Source: Ambulatory Visit

## 2016-08-15 ENCOUNTER — Ambulatory Visit (HOSPITAL_COMMUNITY): Payer: 59

## 2016-08-20 ENCOUNTER — Encounter: Payer: Self-pay | Admitting: Orthopaedic Surgery

## 2016-08-20 ENCOUNTER — Ambulatory Visit (INDEPENDENT_AMBULATORY_CARE_PROVIDER_SITE_OTHER): Payer: 59 | Admitting: Orthopaedic Surgery

## 2016-08-20 VITALS — BP 171/98 | HR 86 | Temp 96.8°F | Ht 67.0 in | Wt 368.0 lb

## 2016-08-20 DIAGNOSIS — I890 Lymphedema, not elsewhere classified: Secondary | ICD-10-CM | POA: Diagnosis not present

## 2016-08-20 DIAGNOSIS — S83241A Other tear of medial meniscus, current injury, right knee, initial encounter: Secondary | ICD-10-CM

## 2016-08-20 DIAGNOSIS — S83281A Other tear of lateral meniscus, current injury, right knee, initial encounter: Secondary | ICD-10-CM | POA: Diagnosis not present

## 2016-08-20 DIAGNOSIS — S83511A Sprain of anterior cruciate ligament of right knee, initial encounter: Secondary | ICD-10-CM | POA: Diagnosis not present

## 2016-08-20 NOTE — Patient Instructions (Signed)
She will talk to her family and let us know which university she wants to be referred to.

## 2016-08-20 NOTE — Progress Notes (Signed)
Subjective: My right knee gives way    Patient ID: Sharon Garcia, female    DOB: [birth year], 50 y.o.   MRN: [id number]  HPI She has a half year history of progressively worse pain of the right knee. She has giving way with falling of the right knee. She has swelling and popping.  She has no redness.  She is using crutches now as her knee keeps giving way all the time.  She has seen Dr. Hilma Favors and obtained MRI of the right knee which shows: IMPRESSION: 1. Intra-articular detail is limited by body habitus. The knee coil could not be utilized. 2. Age advanced tricompartmental degenerative changes, most advanced in the lateral compartment. No acute osseous findings seen. 3. ACL deficient knee.  No acute ligamentous findings. 4. Extensive degeneration of both menisci with probable degenerative tearing in the posterior horn of the medial meniscus. No displaced meniscal fragments identified.  She also has severe lymphedema of both lower legs.  She has a machine she uses at home to help with this but her swelling continues and gets worse as the day goes on.  She has tried rest, crutches, ice, elevation, ibuprofen with little help.   Review of Systems  HENT: Negative for congestion.   Respiratory: Positive for shortness of breath. Negative for cough.   Cardiovascular: Positive for leg swelling. Negative for chest pain.  Endocrine: Positive for cold intolerance.  Musculoskeletal: Positive for arthralgias, gait problem and joint swelling.   Past Medical History:  Diagnosis Date  . Asthma   . Bronchitis   . Diabetes mellitus   . Hyperlipidemia   . Hypertension   . Hyperthyroidism    s/p radiation  . Lymphedema     Past Surgical History:  Procedure Laterality Date  . thyroid radiation      Current Outpatient Prescriptions on File Prior to Visit  Medication Sig Dispense Refill  . citalopram (CELEXA) 20 MG tablet Take 20 mg by mouth daily.    . hydrochlorothiazide (HYDRODIURIL)  25 MG tablet Take 1 tablet (25 mg total) by mouth daily. 90 tablet 1  . INVOKANA 100 MG TABS tablet Take 1 tablet by mouth  daily before breakfast 90 tablet 0  . metFORMIN (GLUCOPHAGE-XR) 500 MG 24 hr tablet Take 1 tablet by mouth two  times daily 180 tablet 0  . omeprazole (PRILOSEC) 40 MG capsule Take 40 mg by mouth daily.    Marland Kitchen POTASSIUM PO Take by mouth. Over the counter    . diltiazem (DILACOR XR) 180 MG 24 hr capsule Take 180 mg by mouth daily.    Marland Kitchen levothyroxine (SYNTHROID, LEVOTHROID) 125 MCG tablet Take 1 tablet by mouth  daily (Patient not taking: Reported on 08/20/2016) 90 tablet 1   No current facility-administered medications on file prior to visit.     Social History   Social History  . Marital status: Married    Spouse name: Juleen China  . Number of children: 1  . Years of education: 24   Occupational History  . 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 Smoking status: Former Smoker    Packs/day: 0.30    Years: 10.00    Types: Cigarettes    Quit date: 01/05/2005  . Smokeless tobacco: Never Used  . Alcohol use No  . Drug use: No  . Sexual activity: Not on file   Other Topics Concern  . Not on file   Social History Narrative   Married.  Lives  in [place] with husband.  Independent of ADLs.    Family History  Problem Relation Age of Onset  . Kidney disease Mother   . Multiple myeloma Mother   . Diabetes Mother   . Bone cancer Mother   . Hypertension Mother   . Cancer Mother   . Diabetes Father   . Heart attack Father   . Hypertension Father   . Lung cancer Maternal Grandmother   . Congestive Heart Failure Maternal Grandmother   . Heart attack Maternal Grandfather   . Deep vein thrombosis Paternal Grandmother   . Colon cancer Neg Hx     BP (!) 171/98   Pulse 86   Temp (!) 96.8 F (36 C)   Ht _0  (1.702 m)   Wt (!) 368 lb (166.9 kg)   BMI 57.64 kg/m      Objective:   Physical Exam  Constitutional: She is oriented to person,  place, and time. She appears well-developed and well-nourished.  HENT:  Head: Normocephalic and atraumatic.  Eyes: Conjunctivae and EOM are normal. Pupils are equal, round, and reactive to light.  Neck: Normal range of motion. Neck supple.  Cardiovascular: Normal rate, regular rhythm and intact distal pulses.   Pulmonary/Chest: Effort normal.  Abdominal: Soft.  Musculoskeletal: She exhibits tenderness (She has pain of the right knee wtih ROM 0 to 90 with pain.  Positive lateral McMurray and + drawer sign right knee.  Massive lymphedema both legs.  Left knee with no pain.  Right knee unstable.).  Neurological: She is alert and oriented to person, place, and time. She displays normal reflexes. No cranial nerve deficit. She exhibits normal muscle tone. Coordination normal.  Skin: Skin is warm and dry.  Psychiatric: She has a normal mood and affect. Her behavior is normal. Judgment and thought content normal.          Assessment & Plan:   Encounter Diagnoses  Name Primary?  . Acute lateral meniscus tear of right knee, initial encounter Yes  . Acute medial meniscus tear of right knee, initial encounter   . Rupture of anterior cruciate ligament of right knee, initial encounter   . Lymphedema of both lower extremities    I have explained the findings of the MRI and her pre-existing lymphedema to her.  I have recommended she be seen at a [facility] for further evaluation and possible surgery.  She understands and agrees.  She will let us know where she would like to go after discussing with her family.  Electronically Signed Sanjuana Kava, MD 10/10/20172:50 PM

## 2016-08-21 ENCOUNTER — Ambulatory Visit: Payer: 59

## 2016-08-29 ENCOUNTER — Telehealth (HOSPITAL_COMMUNITY): Payer: Self-pay | Admitting: Cardiovascular Disease

## 2016-08-29 NOTE — Telephone Encounter (Signed)
Pt did call back in regards to rescheduling her Myoview :  Patient stated that she is still having problems with her knee and would rather wait until everything has concluded with that before rescheduling this test. She will be removed from the workqueue . When she calls back and wants to reschedule this appt I will contact the ordering physician so that a new order can be placed.

## 2016-08-30 ENCOUNTER — Ambulatory Visit (HOSPITAL_COMMUNITY): Payer: 59

## 2016-08-30 ENCOUNTER — Other Ambulatory Visit: Payer: Self-pay | Admitting: "Endocrinology

## 2016-08-30 NOTE — Telephone Encounter (Signed)
Left message to call back  

## 2016-09-07 ENCOUNTER — Other Ambulatory Visit: Payer: Self-pay | Admitting: "Endocrinology

## 2016-09-09 ENCOUNTER — Telehealth: Payer: Self-pay | Admitting: "Endocrinology

## 2016-09-09 MED ORDER — METFORMIN HCL 1000 MG PO TABS: 1000.0000 mg | ORAL_TABLET | Freq: Two times a day (BID) | ORAL | 3 refills | Status: AC

## 2016-09-09 NOTE — Telephone Encounter (Signed)
Pt is requesting refill for Metformin XR 500mg  bid. Pts chart states she should be taking Metformin 1000mg  bid.

## 2016-09-09 NOTE — Telephone Encounter (Signed)
Proceed with Refill with metformin 1000 mg by mouth twice a day.

## 2016-09-09 NOTE — Telephone Encounter (Signed)
Has yeast infection and would like meds called in to [facility] in [place]

## 2016-09-10 ENCOUNTER — Other Ambulatory Visit: Payer: Self-pay | Admitting: "Endocrinology

## 2016-09-10 MED ORDER — FLUCONAZOLE 150 MG PO TABS: 150.0000 mg | ORAL_TABLET | Freq: Once | ORAL | 0 refills | Status: AC

## 2016-09-12 ENCOUNTER — Ambulatory Visit: Payer: 59 | Attending: Sports Medicine | Admitting: Physical Therapy

## 2016-09-12 DIAGNOSIS — M6281 Muscle weakness (generalized): Secondary | ICD-10-CM | POA: Diagnosis present

## 2016-09-12 DIAGNOSIS — M25561 Pain in right knee: Secondary | ICD-10-CM | POA: Insufficient documentation

## 2016-09-12 NOTE — Therapy (Signed)
[facility] [place], [place], [address] Phone: [phone]   Fax:  [phone]  Physical Therapy Evaluation  Patient Details  Name: Sharon Garcia MRN: [id number] Date of Birth: 1966/01/21 Referring Provider: Corky Mull MD.  Encounter Date: 09/12/2016      PT End of Session - 09/12/16 1851    Visit Number 1   Number of Visits 12   Date for PT Re-Evaluation 10/24/16   PT Start Time 0315   PT Stop Time 0404   PT Time Calculation (min) 49 min   Activity Tolerance Patient tolerated treatment well   Behavior During Therapy [facility] for tasks assessed/performed      Past Medical History:  Diagnosis Date  . Asthma   . Bronchitis   . Diabetes mellitus   . Hyperlipidemia   . Hypertension   . Hyperthyroidism    s/p radiation  . Lymphedema     Past Surgical History:  Procedure Laterality Date  . thyroid radiation      There were no vitals filed for this visit.       Subjective Assessment - 09/12/16 1757    Subjective The patient reports that during the second week of August 2017 she slipped and fell and may have twisted her right knee.  Since the injury her knee has hurt and fells unstable.  An MRI revealed Age advanced tricompartmental degenerative changes; ACL deficient and mensical tearing.  Her pain-level is a low 2/10 today but she is using bilateral axillary crutches due to her right knee feeling as it might "give way."   Pertinent History Lymphedema.   Limitations Walking   How long can you walk comfortably? Walking with crutches for safety.   Patient Stated Goals Walk normally without pain.   Currently in Pain? Yes   Pain Score 2    Pain Location Knee   Pain Descriptors / Indicators Aching   Pain Type Acute pain   Pain Onset More than a month ago   Pain Frequency Constant   Aggravating Factors  Walking.   Pain Relieving Factors Rest.            [facility] PT Assessment - 09/12/16 0001      Assessment    Medical Diagnosis RT ACL RUPTURE.   Referring Provider Corky Mull MD.   Onset Date/Surgical Date --  August 2017.     Precautions   Precautions Fall     Restrictions   Weight Bearing Restrictions No     Balance Screen   Has the patient fallen in the past 6 months Yes   How many times? --  1.   Has the patient had a decrease in activity level because of a fear of falling?  Yes   Is the patient reluctant to leave their home because of a fear of falling?  No     Home Environment   Living Environment Private residence     Prior Function   Level of Independence Independent     Observation/Other Assessments   Observations Bilateral LE lymphedema.     ROM / Strength   AROM / PROM / Strength AROM;Strength     AROM   Overall AROM Comments 0 to 100 degrees AAROM.     Strength   Overall Strength Comments Right hip= 3-/5 and right knee 3 to 3+/5.     Palpation   Palpation comment Tender to palpation over right knee lateral and medial joint lines.     Special  Tests    Special Tests --  Unable to perform RT knee stability tests due to girth.     Ambulation/Gait   Gait Comments Patient is currently ambulating with bilateral axillary crutches.                   [facility] Adult PT Treatment/Exercise - 09/12/16 0001      Modalities   Modalities Electrical Stimulation     Electrical Stimulation   Electrical Stimulation Location Right knee (medial/lateral joint line).   Electrical Stimulation Action IFC   Electrical Stimulation Parameters 80-150 Hz x 15 minutes.   Electrical Stimulation Goals Pain                     PT Long Term Goals - 09/12/16 1859      PT LONG TERM GOAL #1   Title Independent with an HEP.   Time 6   Period Weeks   Status New     PT LONG TERM GOAL #2   Title Perform an unassisted right SLR.   Time 6   Period Weeks   Status New     PT LONG TERM GOAL #3   Title Walk unassisted with no c/o right knee giving way.   Time  6   Period Weeks               Plan - 09/12/16 1851    Clinical Impression Statement The patient presents with a low right knee pain-level today.  Her right LE range of motion and strength is limited but this is also due to severe LE lymphedema.  She c/o tenderness upon palpation today over her right knee medial and lateral joint lines.  She is ambulating with bilateral axillary crutuches due to right knee instability.   Rehab Potential Good   PT Frequency 2x / week   PT Duration 6 weeks   PT Treatment/Interventions ADLs/Self Care Home Management;Cryotherapy;Electrical Stimulation;Ultrasound;Moist Heat;Therapeutic activities;Therapeutic exercise;Patient/family education;Manual techniques   PT Next Visit Plan Nustep; begin with assisted SLR's; right hamstring strengthening.  Modalities PRN.   Consulted and Agree with Plan of Care Patient      Patient will benefit from skilled therapeutic intervention in order to improve the following deficits and impairments:  Pain, Decreased activity tolerance, Decreased strength  Visit Diagnosis: Acute pain of right knee - Plan: PT plan of care cert/re-cert  Muscle weakness (generalized) - Plan: PT plan of care cert/re-cert     Problem List Patient Active Problem List   Diagnosis Date Noted  . Hypothyroidism following radioiodine therapy 11/02/2015  . Uncontrolled type 2 diabetes mellitus without complication, without long-term current use of insulin ([place]) 11/02/2015  . Undiagnosed cardiac murmurs 08/01/2015  . Abdominal mass 08/01/2015  . IDA (iron deficiency anemia) 08/01/2015  . Hypokalemia 01/06/2012  . Bronchitis with bronchospasm 01/06/2012  . DM (diabetes mellitus) ([place]) 01/06/2012  . Morbid obesity ([place]) 01/06/2012  . HTN (hypertension) 01/06/2012  . Depression 01/06/2012    Blayn Whetsell, [place] MPT 09/12/2016, 7:03 PM  [facility] [address] [place], [place], [address] Phone:  [phone]   Fax:  [phone]  Name: KENDELL STARIN MRN: [id number] Date of Birth: 1966/02/07

## 2016-09-16 ENCOUNTER — Ambulatory Visit: Payer: 59 | Admitting: Physical Therapy

## 2016-09-16 ENCOUNTER — Encounter: Payer: Self-pay | Admitting: Physical Therapy

## 2016-09-16 DIAGNOSIS — M25561 Pain in right knee: Secondary | ICD-10-CM | POA: Diagnosis not present

## 2016-09-16 DIAGNOSIS — M6281 Muscle weakness (generalized): Secondary | ICD-10-CM

## 2016-09-16 NOTE — Patient Instructions (Addendum)
Hamstring Curl: Resisted (Sitting)    Facing anchor with tubing on right ankle, leg straight out, bend knee. Repeat _10___ times per set. Do __2-3__ sets per session. Do _2-3___ sessions per day.   http://orth.exer.us/668   Copyright  VHI. All rights reserved.  Knee Extension: Resisted (Sitting)    With band looped around right ankle and under other foot, straighten leg with ankle loop. Keep other leg bent to increase resistance. Repeat __10__ times per set. Do __2-3_ sets per session. Do __2-3__ sessions per day.  http://orth.exer.us/690   Copyright  VHI. All rights reserved.  Hip External Rotation: Resisted    Laying on your back, tie red theraband just above your knees. Keep your feet together and bring your knees apart. Repeat __10__ times per set. Do __2-3__ sets per session. Do __2-3__ sessions per day.  http://orth.exer.us/696   Copyright  VHI. All rights reserved.

## 2016-09-16 NOTE — Therapy (Signed)
[facility] [place], [place], [address] Phone: [phone]   Fax:  [phone]  Physical Therapy Treatment  Patient Details  Name: Sharon Garcia MRN: [id number] Date of Birth: 1966-05-29 Referring Provider: Corky Mull MD.  Encounter Date: 09/16/2016      PT End of Session - 09/16/16 1601    Visit Number 2   Number of Visits 12   Date for PT Re-Evaluation 10/24/16   PT Start Time [id number]   PT Stop Time 1641   PT Time Calculation (min) 43 min   Activity Tolerance Patient tolerated treatment well   Behavior During Therapy [facility] for tasks assessed/performed      Past Medical History:  Diagnosis Date  . Asthma   . Bronchitis   . Diabetes mellitus   . Hyperlipidemia   . Hypertension   . Hyperthyroidism    s/p radiation  . Lymphedema     Past Surgical History:  Procedure Laterality Date  . thyroid radiation      There were no vitals filed for this visit.      Subjective Assessment - 09/16/16 1559    Subjective Reports that knee feels okay at this time. Reports that at times she has medial knee pain while sitting at work and then pain at night with laying on her side. Reports feeling needle type sensation to inferior R knee.   Pertinent History Lymphedema.   Limitations Walking   How long can you walk comfortably? Walking with crutches for safety.   Patient Stated Goals Walk normally without pain.   Currently in Pain? No/denies            [facility] PT Assessment - 09/16/16 0001      Assessment   Medical Diagnosis RT ACL RUPTURE.   Next MD Visit 10/17/2016     Precautions   Precautions Fall     Restrictions   Weight Bearing Restrictions No                     [facility] Adult PT Treatment/Exercise - 09/16/16 0001      Exercises   Exercises Knee/Hip     Knee/Hip Exercises: Aerobic   Nustep L4 x15 min     Knee/Hip Exercises: Seated   Long Arc Quad Strengthening;Right;3 sets;10  reps;Weights   Long Arc Quad Weight 3 lbs.   Hamstring Curl Strengthening;Right;3 sets;10 reps   Hamstring Limitations red therabad     Knee/Hip Exercises: Supine   Straight Leg Raises AAROM;Right;2 sets;10 reps   Other Supine Knee/Hip Exercises clamshell red theraband 3x10 reps     Modalities   Modalities Electrical Stimulation     Electrical Stimulation   Electrical Stimulation Location R knee   Electrical Stimulation Action IFC   Electrical Stimulation Parameters 1-10 hz x15 min   Electrical Stimulation Goals Pain                PT Education - 09/16/16 1634    Education provided Yes   Education Details HEP- clamshell, LAQ and HS curl with red theraband   Person(s) Educated Patient   Methods Explanation;Demonstration;Verbal cues;Handout   Comprehension Verbalized understanding;Returned demonstration;Verbal cues required             PT Long Term Goals - 09/12/16 1859      PT LONG TERM GOAL #1   Title Independent with an HEP.   Time 6   Period Weeks   Status New     PT LONG  TERM GOAL #2   Title Perform an unassisted right SLR.   Time 6   Period Weeks   Status New     PT LONG TERM GOAL #3   Title Walk unassisted with no c/o right knee giving way.   Time 6   Period Weeks               Plan - 09/16/16 1636    Clinical Impression Statement Patient presented in clinic today with no R knee pain and continues to ambulate with bilateral axillary crutches due to knee instability. Patient able to complete exercises as directed with red theraband for resistance without complaint of pain. Patient reported anticipating soreness tomorrow due to weakness. Patient required minimal assist for RLE SLR today in supine. Patient educated regarding HEP for strengthening as well as technique all exercises. Patient educated to judge sets of repititions based on soreness and weakness and if pain ever presented in R knee then to stop exercise. Patient denied any  tenderness to palpation along medial and lateral sides of R knee. Normal stimulation response noted following removal of the modalities.   Rehab Potential Good   PT Frequency 2x / week   PT Duration 6 weeks   PT Treatment/Interventions ADLs/Self Care Home Management;Cryotherapy;Electrical Stimulation;Ultrasound;Moist Heat;Therapeutic activities;Therapeutic exercise;Patient/family education;Manual techniques   PT Next Visit Plan Nustep; begin with assisted SLR's; right hamstring strengthening.  Modalities PRN.   PT Home Exercise Plan HEP- clamshell, LAQ and HS curl with red theraband   Consulted and Agree with Plan of Care Patient      Patient will benefit from skilled therapeutic intervention in order to improve the following deficits and impairments:  Pain, Decreased activity tolerance, Decreased strength  Visit Diagnosis: Acute pain of right knee  Muscle weakness (generalized)     Problem List Patient Active Problem List   Diagnosis Date Noted  . Hypothyroidism following radioiodine therapy 11/02/2015  . Uncontrolled type 2 diabetes mellitus without complication, without long-term current use of insulin ([place]) 11/02/2015  . Undiagnosed cardiac murmurs 08/01/2015  . Abdominal mass 08/01/2015  . IDA (iron deficiency anemia) 08/01/2015  . Hypokalemia 01/06/2012  . Bronchitis with bronchospasm 01/06/2012  . DM (diabetes mellitus) ([place]) 01/06/2012  . Morbid obesity ([place]) 01/06/2012  . HTN (hypertension) 01/06/2012  . Depression 01/06/2012    Wynelle Fanny, PTA 09/16/2016, 4:54 PM  [facility] [address] [place], [place], [address] Phone: [phone]   Fax:  [phone]  Name: Sharon Garcia MRN: [id number] Date of Birth: [birth year]

## 2016-09-17 ENCOUNTER — Ambulatory Visit: Payer: 59 | Admitting: *Deleted

## 2016-09-17 DIAGNOSIS — M6281 Muscle weakness (generalized): Secondary | ICD-10-CM

## 2016-09-17 DIAGNOSIS — M25561 Pain in right knee: Secondary | ICD-10-CM

## 2016-09-17 NOTE — Therapy (Signed)
[facility] [place], [place], [address] Phone: [phone]   Fax:  [phone]  Physical Therapy Treatment  Patient Details  Name: CLAIRA OLIVA MRN: [id number] Date of Birth: [birth year] Referring Provider: Corky Mull MD.  Encounter Date: 09/17/2016      PT End of Session - 09/17/16 1613    Visit Number 3   Number of Visits 12   Date for PT Re-Evaluation 10/24/16   PT Start Time 1600   PT Stop Time 1704   PT Time Calculation (min) 64 min      Past Medical History:  Diagnosis Date  . Asthma   . Bronchitis   . Diabetes mellitus   . Hyperlipidemia   . Hypertension   . Hyperthyroidism    s/p radiation  . Lymphedema     Past Surgical History:  Procedure Laterality Date  . thyroid radiation      There were no vitals filed for this visit.      Subjective Assessment - 09/17/16 1616    Subjective Reports that knee feels okay at this time. Reports that at times she has medial knee pain while sitting at work and then pain at night with laying on her side. Reports feeling needle type sensation to inferior R knee.   Pertinent History Lymphedema.   Limitations Walking   How long can you walk comfortably? Walking with crutches for safety.   Patient Stated Goals Walk normally without pain.   Currently in Pain? No/denies                         [facility] Adult PT Treatment/Exercise - 09/17/16 0001      Exercises   Exercises Knee/Hip     Knee/Hip Exercises: Aerobic   Nustep L5 x15 min   seat 12     Knee/Hip Exercises: Standing   Other Standing Knee Exercises standing with crutches TKEs with XTS pinkband  6x10     Knee/Hip Exercises: Seated   Long [place] Quad Strengthening;Right;3 sets;15 reps   Long Arc Quad Weight 4 lbs.   Hamstring Curl Strengthening;Right;3 sets;10 reps   Hamstring Limitations red therabad     Knee/Hip Exercises: Supine   Straight Leg Raises AAROM;Right;10 reps;3 sets     Modalities   Modalities Social worker Location R knee IFC x15 mins 80-150hz    Electrical Stimulation Action IFC   Electrical Stimulation Goals Pain     Manual Therapy   Manual Therapy Passive ROM   Passive ROM AAROM SLR sx10                PT Education - 09/16/16 1634    Education provided Yes   Education Details HEP- clamshell, LAQ and HS curl with red theraband   Person(s) Educated Patient   Methods Explanation;Demonstration;Verbal cues;Handout   Comprehension Verbalized understanding;Returned demonstration;Verbal cues required             PT Long Term Goals - 09/12/16 1859      PT LONG TERM GOAL #1   Title Independent with an HEP.   Time 6   Period Weeks   Status New     PT LONG TERM GOAL #2   Title Perform an unassisted right SLR.   Time 6   Period Weeks   Status New     PT LONG TERM GOAL #3   Title Walk unassisted with no c/o right  knee giving way.   Time 6   Period Weeks               Plan - 09/17/16 1613    Clinical Impression Statement Pt did fairly well with Rx today. She was able to complete all exs with minimal pain . We increased LAQs to 4#s and Pt found it challenging, but was able to complete ex. She still needs assistance on SLR due to weakness. She did well with IFC and ice pack   Rehab Potential Good   PT Frequency 2x / week   PT Duration 6 weeks   PT Treatment/Interventions ADLs/Self Care Home Management;Cryotherapy;Electrical Stimulation;Ultrasound;Moist Heat;Therapeutic activities;Therapeutic exercise;Patient/family education;Manual techniques   PT Next Visit Plan Nustep; begin with assisted SLR's; right hamstring strengthening.  Modalities PRN.   PT Home Exercise Plan HEP- clamshell, LAQ and HS curl with red theraband   Consulted and Agree with Plan of Care Patient      Patient will benefit from skilled therapeutic intervention in order to improve the following  deficits and impairments:  Pain, Decreased activity tolerance, Decreased strength  Visit Diagnosis: Acute pain of right knee  Muscle weakness (generalized)     Problem List Patient Active Problem List   Diagnosis Date Noted  . Hypothyroidism following radioiodine therapy 11/02/2015  . Uncontrolled type 2 diabetes mellitus without complication, without long-term current use of insulin ([place]) 11/02/2015  . Undiagnosed cardiac murmurs 08/01/2015  . Abdominal mass 08/01/2015  . IDA (iron deficiency anemia) 08/01/2015  . Hypokalemia 01/06/2012  . Bronchitis with bronchospasm 01/06/2012  . DM (diabetes mellitus) ([place]) 01/06/2012  . Morbid obesity ([place]) 01/06/2012  . HTN (hypertension) 01/06/2012  . Depression 01/06/2012    Vanesha Athens,CHRIS, PTA 09/17/2016, 5:21 PM  [facility] [address] [place], [place], [address] Phone: [phone]   Fax:  [phone]  Name: ADANNA MAJOROS MRN: [id number] Date of Birth: August 01, 1966

## 2016-09-23 NOTE — Telephone Encounter (Signed)
Left message to call back  

## 2016-09-24 ENCOUNTER — Ambulatory Visit: Payer: 59 | Admitting: Physical Therapy

## 2016-09-24 ENCOUNTER — Encounter: Payer: Self-pay | Admitting: Physical Therapy

## 2016-09-24 DIAGNOSIS — M25561 Pain in right knee: Secondary | ICD-10-CM | POA: Diagnosis not present

## 2016-09-24 DIAGNOSIS — M6281 Muscle weakness (generalized): Secondary | ICD-10-CM

## 2016-09-24 NOTE — Therapy (Signed)
[facility] [place], [place], [address] Phone: [phone]   Fax:  [phone]  Physical Therapy Treatment  Patient Details  Name: Sharon Garcia MRN: [id number] Date of Birth: [birth year] Referring Provider: Corky Mull MD.  Encounter Date: 09/24/2016      PT End of Session - 09/24/16 1558    Visit Number 4   Number of Visits 12   Date for PT Re-Evaluation 10/24/16   PT Start Time 1601   PT Stop Time 1647   PT Time Calculation (min) 46 min   Activity Tolerance Patient tolerated treatment well   Behavior During Therapy [facility] for tasks assessed/performed      Past Medical History:  Diagnosis Date  . Asthma   . Bronchitis   . Diabetes mellitus   . Hyperlipidemia   . Hypertension   . Hyperthyroidism    s/p radiation  . Lymphedema     Past Surgical History:  Procedure Laterality Date  . thyroid radiation      There were no vitals filed for this visit.      Subjective Assessment - 09/24/16 1558    Subjective Reports that she has not completed exercises daily due to soreness in R Quad. Reports that she has been walking around her home and over the weekend she walked in church and a resturant without her crutches. Reported no difficulty except after standing for a period of time waiting in line.   Pertinent History Lymphedema.   Limitations Walking   How long can you walk comfortably? Walking with crutches for safety.   Patient Stated Goals Walk normally without pain.   Currently in Pain? No/denies            [facility] PT Assessment - 09/24/16 0001      Assessment   Medical Diagnosis RT ACL RUPTURE.   Next MD Visit 10/17/2016     Precautions   Precautions Fall     Restrictions   Weight Bearing Restrictions No                     [facility] Adult PT Treatment/Exercise - 09/24/16 0001      Knee/Hip Exercises: Aerobic   Nustep L5 x16 min   seat 12     Knee/Hip Exercises: Standing   Terminal Knee Extension Limitations x20 reps Pink XTS LLE     Knee/Hip Exercises: Seated   Long Arc Quad Strengthening;Right;3 sets;10 reps;Weights   Long Arc Quad Weight 4 lbs.   Hamstring Curl Strengthening;Right;3 sets;10 reps   Hamstring Limitations red therabad   Abduction/Adduction  Strengthening;Right;3 sets;10 reps   Abd/Adduction Limitations Red theraband     Knee/Hip Exercises: Supine   Straight Leg Raises AAROM;Right;2 sets;10 reps     Modalities   Modalities Electrical Stimulation;Cryotherapy     Cryotherapy   Number Minutes Cryotherapy 15 Minutes   Cryotherapy Location Knee   Type of Cryotherapy Ice pack     Electrical Stimulation   Electrical Stimulation Location R knee    Electrical Stimulation Action IFC   Electrical Stimulation Parameters 80-150 hz x15 min   Electrical Stimulation Goals Pain                     PT Long Term Goals - 09/24/16 1635      PT LONG TERM GOAL #1   Title Independent with an HEP.   Time 6   Period Weeks   Status Partially Met  PT LONG TERM GOAL #2   Title Perform an unassisted right SLR.   Time 6   Period Weeks   Status On-going     PT LONG TERM GOAL #3   Title Walk unassisted with no c/o right knee giving way.   Time 6   Period Weeks   Status On-going               Plan - 09/24/16 1637    Clinical Impression Statement Patient tolerated today's treatment well with no complaints of increased pain during exercises. Patient observed as possibly improving in regards to strengthening and also some improvement in regards to AAROM flexion. Patient able to walk small distances without crutches although she reports that she experiences the most difficulty with standing for a long period of time. Normal modalities response noted following removal of the modalities.   Rehab Potential Good   PT Frequency 2x / week   PT Duration 6 weeks   PT Treatment/Interventions ADLs/Self Care Home  Management;Cryotherapy;Electrical Stimulation;Ultrasound;Moist Heat;Therapeutic activities;Therapeutic exercise;Patient/family education;Manual techniques   PT Next Visit Plan Continue RLE strengthening and with modalities PRN for pain per MPT POC.   PT Home Exercise Plan HEP- clamshell, LAQ and HS curl with red theraband   Consulted and Agree with Plan of Care Patient      Patient will benefit from skilled therapeutic intervention in order to improve the following deficits and impairments:  Pain, Decreased activity tolerance, Decreased strength  Visit Diagnosis: Acute pain of right knee  Muscle weakness (generalized)     Problem List Patient Active Problem List   Diagnosis Date Noted  . Hypothyroidism following radioiodine therapy 11/02/2015  . Uncontrolled type 2 diabetes mellitus without complication, without long-term current use of insulin ([place]) 11/02/2015  . Undiagnosed cardiac murmurs 08/01/2015  . Abdominal mass 08/01/2015  . IDA (iron deficiency anemia) 08/01/2015  . Hypokalemia 01/06/2012  . Bronchitis with bronchospasm 01/06/2012  . DM (diabetes mellitus) ([place]) 01/06/2012  . Morbid obesity ([place]) 01/06/2012  . HTN (hypertension) 01/06/2012  . Depression 01/06/2012    Wynelle Fanny, PTA 09/24/2016, 5:57 PM  [facility] [address] [place], [place], [address] Phone: [phone]   Fax:  [phone]  Name: LAYSA KIMMEY MRN: [id number] Date of Birth: February 19, 1966

## 2016-09-26 ENCOUNTER — Ambulatory Visit: Payer: 59 | Admitting: Physical Therapy

## 2016-09-26 ENCOUNTER — Encounter: Payer: Self-pay | Admitting: Physical Therapy

## 2016-09-26 DIAGNOSIS — M25561 Pain in right knee: Secondary | ICD-10-CM

## 2016-09-26 DIAGNOSIS — M6281 Muscle weakness (generalized): Secondary | ICD-10-CM

## 2016-09-26 NOTE — Therapy (Signed)
[facility] [place], [place], [address] Phone: [phone]   Fax:  [phone]  Physical Therapy Treatment  Patient Details  Name: Sharon Garcia MRN: [id number] Date of Birth: May 12, 1966 Referring Provider: Corky Mull MD.  Encounter Date: 09/26/2016      PT End of Session - 09/26/16 1602    Visit Number 5   Number of Visits 12   Date for PT Re-Evaluation 10/24/16   PT Start Time 1601   PT Stop Time 1653   PT Time Calculation (min) 52 min   Activity Tolerance Patient tolerated treatment well   Behavior During Therapy [facility] for tasks assessed/performed      Past Medical History:  Diagnosis Date  . Asthma   . Bronchitis   . Diabetes mellitus   . Hyperlipidemia   . Hypertension   . Hyperthyroidism    s/p radiation  . Lymphedema     Past Surgical History:  Procedure Laterality Date  . thyroid radiation      There were no vitals filed for this visit.      Subjective Assessment - 09/26/16 1602    Subjective Reports that her knee is alright today. Reports recently at work that she was walking without her crutchers and she almost fell.   Pertinent History Lymphedema.   Limitations Walking   How long can you walk comfortably? Walking with crutches for safety.   Currently in Pain? No/denies            [facility] PT Assessment - 09/26/16 0001      Assessment   Medical Diagnosis RT ACL RUPTURE.   Next MD Visit 10/17/2016     Precautions   Precautions Fall     Restrictions   Weight Bearing Restrictions No                     [facility] Adult PT Treatment/Exercise - 09/26/16 0001      Knee/Hip Exercises: Aerobic   Nustep L6 x17 min     Knee/Hip Exercises: Standing   Terminal Knee Extension Limitations x30 reps Pink XTS     Knee/Hip Exercises: Seated   Long Arc Quad Strengthening;Right;3 sets;10 reps;Weights   Long Arc Quad Weight 4 lbs.   Hamstring Curl Strengthening;Right;3 sets;10 reps    Hamstring Limitations red therabad   Abduction/Adduction  Strengthening;Right;3 sets;10 reps   Abd/Adduction Limitations Red theraband   Sit to Sand 20 reps;without UE support     Knee/Hip Exercises: Supine   Straight Leg Raises AAROM;Right;2 sets;10 reps     Modalities   Modalities Electrical Stimulation;Cryotherapy     Cryotherapy   Number Minutes Cryotherapy 15 Minutes   Cryotherapy Location Knee   Type of Cryotherapy Ice pack     Electrical Stimulation   Electrical Stimulation Location R knee    Electrical Stimulation Action IFC   Electrical Stimulation Parameters 80-150 hz x15 min   Electrical Stimulation Goals Pain                     PT Long Term Goals - 09/24/16 1635      PT LONG TERM GOAL #1   Title Independent with an HEP.   Time 6   Period Weeks   Status Partially Met     PT LONG TERM GOAL #2   Title Perform an unassisted right SLR.   Time 6   Period Weeks   Status On-going     PT LONG TERM GOAL #  3   Title Walk unassisted with no c/o right knee giving way.   Time 6   Period Weeks   Status On-going               Plan - 09/26/16 1720    Clinical Impression Statement Patient tolerated today's treatment well with no R knee pain although she still has difficulty with prolonged standing or walking. Patient able to walk short distances without bilateral axillary crutches per patient report. Patient experienced R knee popping sensation that was not painful per patient report with standing TKEs with pink XTS. Patient able to complete all other exercises well although by the end of therapeutic exercise patient was fatigued. Normal modalities response noted following removal of the modalities.    Rehab Potential Good   PT Frequency 2x / week   PT Duration 6 weeks   PT Treatment/Interventions ADLs/Self Care Home Management;Cryotherapy;Electrical Stimulation;Ultrasound;Moist Heat;Therapeutic activities;Therapeutic exercise;Patient/family  education;Manual techniques   PT Next Visit Plan Continue RLE strengthening and with modalities PRN for pain per MPT POC.   PT Home Exercise Plan HEP- clamshell, LAQ and HS curl with red theraband   Consulted and Agree with Plan of Care Patient      Patient will benefit from skilled therapeutic intervention in order to improve the following deficits and impairments:  Pain, Decreased activity tolerance, Decreased strength  Visit Diagnosis: Acute pain of right knee  Muscle weakness (generalized)     Problem List Patient Active Problem List   Diagnosis Date Noted  . Hypothyroidism following radioiodine therapy 11/02/2015  . Uncontrolled type 2 diabetes mellitus without complication, without long-term current use of insulin ([place]) 11/02/2015  . Undiagnosed cardiac murmurs 08/01/2015  . Abdominal mass 08/01/2015  . IDA (iron deficiency anemia) 08/01/2015  . Hypokalemia 01/06/2012  . Bronchitis with bronchospasm 01/06/2012  . DM (diabetes mellitus) ([place]) 01/06/2012  . Morbid obesity ([place]) 01/06/2012  . HTN (hypertension) 01/06/2012  . Depression 01/06/2012    Wynelle Fanny, PTA 09/26/2016, 5:42 PM  [facility] [address] [place], [place], [address] Phone: [phone]   Fax:  [phone]  Name: Sharon Garcia MRN: [id number] Date of Birth: 1966/10/19

## 2016-09-30 ENCOUNTER — Ambulatory Visit: Payer: 59 | Admitting: Physical Therapy

## 2016-09-30 DIAGNOSIS — M25561 Pain in right knee: Secondary | ICD-10-CM | POA: Diagnosis not present

## 2016-09-30 DIAGNOSIS — M6281 Muscle weakness (generalized): Secondary | ICD-10-CM

## 2016-09-30 NOTE — Therapy (Signed)
[facility] [place], [place], [address] Phone: [phone]   Fax:  [phone]  Physical Therapy Treatment  Patient Details  Name: Sharon Garcia MRN: [id number] Date of Birth: 1966-06-16 Referring Provider: Corky Mull MD.  Encounter Date: 09/30/2016      PT End of Session - 09/30/16 1643    Visit Number 6   Number of Visits 12   Date for PT Re-Evaluation 10/24/16   PT Start Time 0400   PT Stop Time 0453   PT Time Calculation (min) 53 min   Activity Tolerance Patient tolerated treatment well   Behavior During Therapy [facility] for tasks assessed/performed      Past Medical History:  Diagnosis Date  . Asthma   . Bronchitis   . Diabetes mellitus   . Hyperlipidemia   . Hypertension   . Hyperthyroidism    s/p radiation  . Lymphedema     Past Surgical History:  Procedure Laterality Date  . thyroid radiation      There were no vitals filed for this visit.      Subjective Assessment - 09/30/16 1643    Subjective No new complaints.   Pain Score 1    Pain Location Knee   Pain Descriptors / Indicators Aching   Pain Onset More than a month ago                         [facility] Adult PT Treatment/Exercise - 09/30/16 0001      Exercises   Exercises Knee/Hip;Ankle     Knee/Hip Exercises: Aerobic   Nustep Level 5 x 15 minutes.     Knee/Hip Exercises: Machines for Strengthening   Cybex Knee Flexion 6 minutes at 50#.     Modalities   Modalities Moist Heat     Moist Heat Therapy   Number Minutes Moist Heat 20 Minutes   Moist Heat Location --  Right knee.     Cryotherapy   Cryotherapy Location --  Right knee.     Acupuncturist Location --  RT Knee.   Electrical Stimulation Action IFC   Electrical Stimulation Parameters 80-150 Hz x 20 minutes.   Electrical Stimulation Goals Pain     Ankle Exercises: Standing   Rocker Board Limitations 3 minutes.                      PT Long Term Goals - 09/24/16 1635      PT LONG TERM GOAL #1   Title Independent with an HEP.   Time 6   Period Weeks   Status Partially Met     PT LONG TERM GOAL #2   Title Perform an unassisted right SLR.   Time 6   Period Weeks   Status On-going     PT LONG TERM GOAL #3   Title Walk unassisted with no c/o right knee giving way.   Time 6   Period Weeks   Status On-going             Patient will benefit from skilled therapeutic intervention in order to improve the following deficits and impairments:  Pain, Decreased activity tolerance, Decreased strength  Visit Diagnosis: Acute pain of right knee  Muscle weakness (generalized)     Problem List Patient Active Problem List   Diagnosis Date Noted  . Hypothyroidism following radioiodine therapy 11/02/2015  . Uncontrolled type 2 diabetes mellitus without complication, without  long-term current use of insulin ([place]) 11/02/2015  . Undiagnosed cardiac murmurs 08/01/2015  . Abdominal mass 08/01/2015  . IDA (iron deficiency anemia) 08/01/2015  . Hypokalemia 01/06/2012  . Bronchitis with bronchospasm 01/06/2012  . DM (diabetes mellitus) ([place]) 01/06/2012  . Morbid obesity ([place]) 01/06/2012  . HTN (hypertension) 01/06/2012  . Depression 01/06/2012    Albina Gosney, [place] MPT 09/30/2016, 5:19 PM  [facility] [address] [place], [place], [address] Phone: [phone]   Fax:  [phone]  Name: Sharon Garcia MRN: [id number] Date of Birth: [birth year]

## 2016-10-08 ENCOUNTER — Ambulatory Visit: Payer: 59 | Admitting: *Deleted

## 2016-10-08 DIAGNOSIS — M6281 Muscle weakness (generalized): Secondary | ICD-10-CM

## 2016-10-08 DIAGNOSIS — M25561 Pain in right knee: Secondary | ICD-10-CM

## 2016-10-08 NOTE — Therapy (Signed)
[facility] [place], [place], [address] Phone: [phone]   Fax:  [phone]  Physical Therapy Treatment  Patient Details  Name: Sharon Garcia MRN: [id number] Date of Birth: Jul 14, 1966 Referring Provider: Corky Mull MD.  Encounter Date: 10/08/2016      PT End of Session - 10/08/16 1644    Visit Number 7   Number of Visits 12   Date for PT Re-Evaluation 10/24/16   PT Start Time 1600   PT Stop Time 1708   PT Time Calculation (min) 68 min      Past Medical History:  Diagnosis Date  . Asthma   . Bronchitis   . Diabetes mellitus   . Hyperlipidemia   . Hypertension   . Hyperthyroidism    s/p radiation  . Lymphedema     Past Surgical History:  Procedure Laterality Date  . thyroid radiation      There were no vitals filed for this visit.      Subjective Assessment - 10/08/16 1618    Subjective RT knee doing better. No crutches at home now. No giving away in about a week. MD 10-17-16   Pertinent History Lymphedema.   Limitations Walking   How long can you walk comfortably? Walking with crutches for safety.   Patient Stated Goals Walk normally without pain.   Currently in Pain? No/denies   Pain Descriptors / Indicators Aching                         [facility] Adult PT Treatment/Exercise - 10/08/16 0001      Exercises   Exercises Knee/Hip;Ankle     Knee/Hip Exercises: Aerobic   Nustep Level 5 x 15 minutes.     Knee/Hip Exercises: Machines for Strengthening   Cybex Knee Extension 10# 3x10   Cybex Knee Flexion  5  x10  at 50#.     Knee/Hip Exercises: Standing   Terminal Knee Extension Limitations x 50 reps Pink XTS   Rocker Board 5 minutes     Modalities   Modalities Cryotherapy;Moist Heat     Moist Heat Therapy   Number Minutes Moist Heat 20 Minutes   Moist Heat Location --  Right knee.     Acupuncturist Location R knee IFC 80-150hz x 15  mins   Electrical Stimulation Goals Pain                     PT Long Term Goals - 09/24/16 1635      PT LONG TERM GOAL #1   Title Independent with an HEP.   Time 6   Period Weeks   Status Partially Met     PT LONG TERM GOAL #2   Title Perform an unassisted right SLR.   Time 6   Period Weeks   Status On-going     PT LONG TERM GOAL #3   Title Walk unassisted with no c/o right knee giving way.   Time 6   Period Weeks   Status On-going               Plan - 10/08/16 1644    Clinical Impression Statement Pt did fairly well today and was able to perform more therex for RT knee strengthening. She has been able to walk around the house and in clinic some without crutches. She felt some grinding under knee cap while performing  knee extensions , but no  pain.  She was fatigued end of Rx. Normal  response to modalities   Rehab Potential Good   PT Duration 6 weeks   PT Treatment/Interventions ADLs/Self Care Home Management;Cryotherapy;Electrical Stimulation;Ultrasound;Moist Heat;Therapeutic activities;Therapeutic exercise;Patient/family education;Manual techniques   PT Next Visit Plan Continue RLE strengthening and with modalities PRN for pain per MPT POC.   PT Home Exercise Plan HEP- clamshell, LAQ and HS curl with red theraband   Consulted and Agree with Plan of Care Patient      Patient will benefit from skilled therapeutic intervention in order to improve the following deficits and impairments:  Pain, Decreased activity tolerance, Decreased strength  Visit Diagnosis: Acute pain of right knee  Muscle weakness (generalized)     Problem List Patient Active Problem List   Diagnosis Date Noted  . Hypothyroidism following radioiodine therapy 11/02/2015  . Uncontrolled type 2 diabetes mellitus without complication, without long-term current use of insulin ([place]) 11/02/2015  . Undiagnosed cardiac murmurs 08/01/2015  . Abdominal mass 08/01/2015  . IDA (iron  deficiency anemia) 08/01/2015  . Hypokalemia 01/06/2012  . Bronchitis with bronchospasm 01/06/2012  . DM (diabetes mellitus) ([place]) 01/06/2012  . Morbid obesity ([place]) 01/06/2012  . HTN (hypertension) 01/06/2012  . Depression 01/06/2012    Jassica Zazueta,CHRIS 10/08/2016, 5:08 PM  [facility] [address] [place], [place], [address] Phone: [phone]   Fax:  [phone]  Name: Sharon Garcia MRN: [id number] Date of Birth: 03/10/1966

## 2016-10-10 ENCOUNTER — Ambulatory Visit: Payer: 59 | Admitting: Physical Therapy

## 2016-10-15 ENCOUNTER — Ambulatory Visit: Payer: 59 | Attending: Sports Medicine | Admitting: Physical Therapy

## 2016-10-15 ENCOUNTER — Encounter: Payer: Self-pay | Admitting: Physical Therapy

## 2016-10-15 DIAGNOSIS — M25561 Pain in right knee: Secondary | ICD-10-CM | POA: Diagnosis not present

## 2016-10-15 DIAGNOSIS — M6281 Muscle weakness (generalized): Secondary | ICD-10-CM | POA: Diagnosis present

## 2016-10-15 NOTE — Therapy (Addendum)
[facility] [place], [place], [address] Phone: [phone]   Fax:  [phone]  Physical Therapy Treatment  Patient Details  Name: Sharon Garcia MRN: [id number] Date of Birth: Oct 18, 1966 Referring Provider: Corky Mull MD.  Encounter Date: 10/15/2016      PT End of Session - 10/15/16 1557    Visit Number 8   Number of Visits 12   Date for PT Re-Evaluation 10/24/16   PT Start Time 1602   PT Stop Time 1643   PT Time Calculation (min) 41 min   Activity Tolerance Patient tolerated treatment well   Behavior During Therapy [facility] for tasks assessed/performed      Past Medical History:  Diagnosis Date  . Asthma   . Bronchitis   . Diabetes mellitus   . Hyperlipidemia   . Hypertension   . Hyperthyroidism    s/p radiation  . Lymphedema     Past Surgical History:  Procedure Laterality Date  . thyroid radiation      There were no vitals filed for this visit.      Subjective Assessment - 10/15/16 1556    Subjective Reports only one instance of slight knee buckling this morning but patient reports barely a buckle. Reports that she sees improvement as she is now able to pick her leg up herself.   Pertinent History Lymphedema.   Limitations Walking   How long can you walk comfortably? Walking with crutches for safety.   Patient Stated Goals Walk normally without pain.   Currently in Pain? No/denies                                      PT Long Term Goals - 10/15/16 1614      PT LONG TERM GOAL #1   Title Independent with an HEP.   Time 6   Period Weeks   Status Partially Met     PT LONG TERM GOAL #2   Title Perform an unassisted right SLR.   Time 6   Period Weeks   Status Achieved     PT LONG TERM GOAL #3   Title Walk unassisted with no c/o right knee giving way.   Time 6   Period Weeks   Status Partially Met  "barely" buckled 10/15/2016 but patient reports that that was the  first instance of buckling in weeks as of 10/15/2016               Plan - 10/15/16 1645    Clinical Impression Statement Patient tolerated today's treatment well as she arrived with no R knee pain and just one instance of R knee "barely" buckling this morning. Patient able to walk short or familiar distances without AD but uses bilateral axillary crutches especially at work where she has a long walk into the building. Patient's RLE strength has improved since evaluation with 4/5-4+/5 of the tested musculature. Patient able to complete SLR without assist now as indicated during today's treatment. Patient arrived with no pain thus modalities were skipped today with patient approval.   Rehab Potential Good   PT Frequency 2x / week   PT Duration 6 weeks   PT Treatment/Interventions ADLs/Self Care Home Management;Cryotherapy;Electrical Stimulation;Ultrasound;Moist Heat;Therapeutic activities;Therapeutic exercise;Patient/family education;Manual techniques   PT Next Visit Plan Continue RLE strengthening and with modalities PRN for pain per MPT POC.   PT Home Exercise Plan HEP- clamshell, LAQ and  HS curl with red theraband   Consulted and Agree with Plan of Care Patient      Patient will benefit from skilled therapeutic intervention in order to improve the following deficits and impairments:  Pain, Decreased activity tolerance, Decreased strength  Visit Diagnosis: Acute pain of right knee  Muscle weakness (generalized)     Problem List Patient Active Problem List   Diagnosis Date Noted  . Hypothyroidism following radioiodine therapy 11/02/2015  . Uncontrolled type 2 diabetes mellitus without complication, without long-term current use of insulin ([place]) 11/02/2015  . Undiagnosed cardiac murmurs 08/01/2015  . Abdominal mass 08/01/2015  . IDA (iron deficiency anemia) 08/01/2015  . Hypokalemia 01/06/2012  . Bronchitis with bronchospasm 01/06/2012  . DM (diabetes mellitus) ([place])  01/06/2012  . Morbid obesity ([place]) 01/06/2012  . HTN (hypertension) 01/06/2012  . Depression 01/06/2012   Ahmed Prima, PTA 10/16/16 10:46 AM [place] App[place] MPT [facility] [place], [place], [address] Phone: [phone]   Fax:  [phone]  Name: Sharon Garcia MRN: [id number] Date of Birth: Jul 23, 1966  PHYSICAL THERAPY DISCHARGE SUMMARY  Visits from Start of Care: 8.  Current functional level related to goals / functional outcomes: See above.   Remaining deficits: See goal section.    Education / Equipment: HEP. Plan: Patient agrees to discharge.  Patient goals were partially met. Patient is being discharged due to being pleased with the current functional level.  ?????        [place] Applegate MPT

## 2016-10-22 ENCOUNTER — Ambulatory Visit: Payer: 59 | Admitting: Physical Therapy

## 2016-10-24 ENCOUNTER — Ambulatory Visit: Payer: 59 | Admitting: *Deleted

## 2016-10-31 NOTE — Telephone Encounter (Signed)
Patient never returned call  

## 2016-11-06 ENCOUNTER — Other Ambulatory Visit: Payer: Self-pay | Admitting: "Endocrinology

## 2016-11-06 LAB — HEMOGLOBIN A1C
HEMOGLOBIN A1C: 8.3 % — AB (ref <5.7)
MEAN PLASMA GLUCOSE: 192 mg/dL

## 2016-11-06 LAB — COMPLETE METABOLIC PANEL WITH GFR
ALT: 14 U/L (ref 6–29)
AST: 16 U/L (ref 10–35)
Albumin: 4 g/dL (ref 3.6–5.1)
Alkaline Phosphatase: 41 U/L (ref 33–130)
BUN: 13 mg/dL (ref 7–25)
CO2: 27 mmol/L (ref 20–31)
Calcium: 9.7 mg/dL (ref 8.6–10.4)
Chloride: 98 mmol/L (ref 98–110)
Creat: 0.61 mg/dL (ref 0.50–1.05)
GFR, Est African American: >89 mL/min (ref >=60)
GLUCOSE: 173 mg/dL — AB (ref 65–99)
POTASSIUM: 3.8 mmol/L (ref 3.5–5.3)
SODIUM: 138 mmol/L (ref 135–146)
Total Bilirubin: 0.7 mg/dL (ref 0.2–1.2)
Total Protein: 6.5 g/dL (ref 6.1–8.1)

## 2016-11-06 LAB — TSH: TSH: 2.67 mIU/L

## 2016-11-06 LAB — T4, FREE: Free T4: 1.3 ng/dL (ref 0.8–1.8)

## 2016-11-14 ENCOUNTER — Ambulatory Visit (INDEPENDENT_AMBULATORY_CARE_PROVIDER_SITE_OTHER): Payer: 59 | Admitting: "Endocrinology

## 2016-11-14 ENCOUNTER — Encounter: Payer: Self-pay | Admitting: "Endocrinology

## 2016-11-14 VITALS — BP 139/82 | HR 91 | Ht 67.0 in | Wt 375.0 lb

## 2016-11-14 DIAGNOSIS — IMO0001 Reserved for inherently not codable concepts without codable children: Secondary | ICD-10-CM

## 2016-11-14 DIAGNOSIS — E1165 Type 2 diabetes mellitus with hyperglycemia: Secondary | ICD-10-CM | POA: Diagnosis not present

## 2016-11-14 DIAGNOSIS — I10 Essential (primary) hypertension: Secondary | ICD-10-CM | POA: Diagnosis not present

## 2016-11-14 DIAGNOSIS — E890 Postprocedural hypothyroidism: Secondary | ICD-10-CM

## 2016-11-14 MED ORDER — LEVOTHYROXINE SODIUM 150 MCG PO TABS: 150.0000 ug | ORAL_TABLET | Freq: Every day | ORAL | 3 refills | Status: DC

## 2016-11-14 NOTE — Progress Notes (Signed)
Subjective:    Patient ID: Sharon Garcia, female    DOB: [birth year], PCP Glo Herring., MD   Past Medical History:  Diagnosis Date  . Asthma   . Bronchitis   . Diabetes mellitus   . Hyperlipidemia   . Hypertension   . Hyperthyroidism    s/p radiation  . Lymphedema    Past Surgical History:  Procedure Laterality Date  . thyroid radiation     Social History   Social History  . Marital status: Married    Spouse name: Juleen China  . Number of children: 1  . Years of education: 44   Occupational History  . 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 Smoking status: Former Smoker    Packs/day: 0.30    Years: 10.00    Types: Cigarettes    Quit date: 01/05/2005  . Smokeless tobacco: Never Used  . Alcohol use No  . Drug use: No  . Sexual activity: Not on file   Other Topics Concern  . Not on file   Social History Narrative   Married.  Lives in [place] with husband.  Independent of ADLs.   Outpatient Encounter Prescriptions as of 11/14/2016  Medication Sig  . INVOKANA 100 MG TABS tablet TAKE 1 TABLET BY MOUTH  DAILY BEFORE BREAKFAST  . levothyroxine (SYNTHROID, LEVOTHROID) 150 MCG tablet Take 1 tablet (150 mcg total) by mouth daily before breakfast.  . metFORMIN (GLUCOPHAGE) 1000 MG tablet Take 1 tablet (1,000 mg total) by mouth 2 (two) times daily with a meal.  . [DISCONTINUED] levothyroxine (SYNTHROID, LEVOTHROID) 125 MCG tablet Take 1 tablet by mouth  daily  . aspirin EC 81 MG tablet Take 81 mg by mouth daily.  . citalopram (CELEXA) 20 MG tablet Take 20 mg by mouth daily.  Marland Kitchen diltiazem (DILACOR XR) 180 MG 24 hr capsule Take 180 mg by mouth daily.  . furosemide (LASIX) 20 MG tablet Take 20 mg by mouth 1 day or 1 dose.  . hydrochlorothiazide (HYDRODIURIL) 25 MG tablet Take 1 tablet (25 mg total) by mouth daily.  . meloxicam (MOBIC) 15 MG tablet Take 15 mg by mouth daily.  Marland Kitchen omeprazole (PRILOSEC) 40 MG capsule Take 40 mg by mouth daily.  Marland Kitchen POTASSIUM  PO Take by mouth. Over the counter  . [DISCONTINUED] metFORMIN (GLUCOPHAGE-XR) 500 MG 24 hr tablet Take 1 tablet by mouth two  times daily (Patient not taking: Reported on 09/12/2016)   No facility-administered encounter medications on file as of 11/14/2016.    ALLERGIES: No Known Allergies VACCINATION STATUS:  There is no immunization history on file for this patient.  HPI  Mrs. Sharon Garcia is a 51 - yr-old patient with medical history as above. She is here to f/u for RAI hypothyroidism , type 2 DM, HTN,  Obesity.  she is on Lt4 125 mcg po qam. She is compliant.. For her diabetes she is taking metformin and Invokana , she has not been consistent on Invokana. - Since her last visit, she lost about 11 pounds. Pt denies family history of thyroid dysfunction . She denies personal history of goiter.  Review of Systems  Constitutional: No weight change, no fatigue, no subjective hyperthermia/hypothermia Eyes: no blurry vision, no xerophthalmia ENT: no sore throat, no nodules palpated in throat, no dysphagia/odynophagia, no hoarseness Cardiovascular: no CP/SOB/palpitations/leg swelling Respiratory: no cough/SOB Gastrointestinal: no N/V/D/C Musculoskeletal: She now uses crutches due to large extremities from lymphedema. Skin: no rashes Neurological: no tremors/numbness/tingling/dizziness Psychiatric: no  depression/anxiety  Objective:    BP 139/82   Pulse 91   Ht 5\' 7"  (1.702 m)   Wt (!) 375 lb (170.1 kg)   BMI 58.73 kg/m   Wt Readings from Last 3 Encounters:  11/14/16 (!) 375 lb (170.1 kg)  08/20/16 (!) 368 lb (166.9 kg)  08/13/16 (!) 386 lb (175.1 kg)    Physical Exam  Constitutional: obese ,  in NAD. Walks with crutches. Eyes: PERRLA, EOMI, no exophthalmos ENT: moist mucous membranes, no thyromegaly, no cervical lymphadenopathy Cardiovascular: RRR, No MRG Respiratory: CTA B Gastrointestinal: abdomen soft, NT, ND, BS+ Musculoskeletal: She has bilateral large extremities due to  lymphedema., No focal deficits..  Skin: moist, warm, no rashes Neurological: no tremor with outstretched hands.    Recent Results (from the past 2160 hour(s))  COMPLETE METABOLIC PANEL WITH GFR     Status: Abnormal   Collection Time: 11/06/16  9:58 AM  Result Value Ref Range   Sodium 138 135 - 146 mmol/L   Potassium 3.8 3.5 - 5.3 mmol/L   Chloride 98 98 - 110 mmol/L   CO2 27 20 - 31 mmol/L   Glucose, Bld 173 (H) 65 - 99 mg/dL   BUN 13 7 - 25 mg/dL   Creat 0.61 0.50 - 1.05 mg/dL    Comment:   For patients > or = 53 years of age: The upper reference limit for Creatinine is approximately 13% higher for people identified as African-American.      Total Bilirubin 0.7 0.2 - 1.2 mg/dL   Alkaline Phosphatase 41 33 - 130 U/L   AST 16 10 - 35 U/L   ALT 14 6 - 29 U/L   Total Protein 6.5 6.1 - 8.1 g/dL   Albumin 4.0 3.6 - 5.1 g/dL   Calcium 9.7 8.6 - 10.4 mg/dL   GFR, Est African American >89 >=60 mL/min   GFR, Est Non African American >89 >=60 mL/min  TSH     Status: None   Collection Time: 11/06/16  9:58 AM  Result Value Ref Range   TSH 2.67 mIU/L    Comment:   Reference Range   > or = 20 Years  0.40-4.50   Pregnancy Range First trimester  0.26-2.66 Second trimester 0.55-2.73 Third trimester  0.43-2.91     T4, free     Status: None   Collection Time: 11/06/16  9:58 AM  Result Value Ref Range   Free T4 1.3 0.8 - 1.8 ng/dL  Hemoglobin A1c     Status: Abnormal   Collection Time: 11/06/16  9:58 AM  Result Value Ref Range   Hgb A1c MFr Bld 8.3 (H) <5.7 %    Comment:   For someone without known diabetes, a hemoglobin A1c value of 6.5% or greater indicates that they may have diabetes and this should be confirmed with a follow-up test.   For someone with known diabetes, a value <7% indicates that their diabetes is well controlled and a value greater than or equal to 7% indicates suboptimal control. A1c targets should be individualized based on duration of diabetes, age,  comorbid conditions, and other considerations.   Currently, no consensus exists for use of hemoglobin A1c for diagnosis of diabetes for children.      Mean Plasma Glucose 192 mg/dL     Assessment & Plan:   1.  RAI induced Hypothyroidism   - Her thyroid function tests are consistent with appropriate replacement, However she would benefit from slight increase in her levothyroxine. I  will prescribe levothyroxine 150 g by mouth every morning.   - We discussed about correct intake of levothyroxine, at fasting, with water, separated by at least 30 minutes from breakfast, and separated by more than 4 hours from calcium, iron, multivitamins, acid reflux medications (PPIs). -Patient is made aware of the fact that thyroid hormone replacement is needed for life, dose to be adjusted by periodic monitoring of thyroid function tests.   2. Uncontrolled type 2 diabetes mellitus   -Her A1c is higher at 8.3% increasing from 7.1%. This is due to significant interruptions in her invokana  intake and dietary indiscretion. I have re-counseled her on carbs restriction.  -Continue metformin 1000 mg by mouth twice a day, and Invokana 100 mg by mouth once a day. I will arrange for consult with the CDE.  3. Essential hypertension  -Uncontrolled however better than last visit. Continue hydrochlorothiazide 25 mg by mouth daily and guilty has been 180 mg XR by mouth daily.  4. Morbid obesity due to excess calories [facility]) -This is a chronic problem for her. I gave her detailed carbohydrates information. She has physical limitations for optimal exercise. I will send her to a dietitian for consult.  - 25 minutes of time was spent on the care of this patient , 50% of which was applied for counseling on diabetes complications and their preventions.  - I advised patient to maintain close follow up with Glo Herring., MD for primary care needs. Follow up plan: Return in about 3 months (around 02/12/2017) for  follow up with pre-visit labs.  Glade Lloyd, MD Phone: [phone]  Fax: [phone]   11/14/2016, 4:10 PM

## 2016-12-14 ENCOUNTER — Other Ambulatory Visit: Payer: Self-pay | Admitting: "Endocrinology

## 2016-12-27 ENCOUNTER — Other Ambulatory Visit: Payer: Self-pay | Admitting: Cardiovascular Disease

## 2016-12-27 NOTE — Telephone Encounter (Signed)
REFILL 

## 2016-12-27 NOTE — Telephone Encounter (Signed)
Refill Request.  

## 2017-01-06 ENCOUNTER — Other Ambulatory Visit: Payer: Self-pay

## 2017-01-06 MED ORDER — FLUCONAZOLE 150 MG PO TABS
150.0000 mg | ORAL_TABLET | Freq: Once | ORAL | 1 refills | Status: AC
Start: 2017-01-06 — End: 2017-01-06

## 2017-02-03 ENCOUNTER — Other Ambulatory Visit: Payer: Self-pay

## 2017-02-03 MED ORDER — CANAGLIFLOZIN 100 MG PO TABS: ORAL_TABLET | ORAL | 0 refills | Status: AC

## 2017-02-07 ENCOUNTER — Other Ambulatory Visit: Payer: Self-pay | Admitting: "Endocrinology

## 2017-02-07 DIAGNOSIS — E890 Postprocedural hypothyroidism: Secondary | ICD-10-CM | POA: Diagnosis not present

## 2017-02-07 DIAGNOSIS — E1165 Type 2 diabetes mellitus with hyperglycemia: Secondary | ICD-10-CM | POA: Diagnosis not present

## 2017-02-08 LAB — COMPREHENSIVE METABOLIC PANEL
A/G RATIO: 1.5 (ref 1.2–2.2)
ALBUMIN: 4.3 g/dL (ref 3.5–5.5)
ALT: 24 IU/L (ref 0–32)
AST: 22 IU/L (ref 0–40)
Alkaline Phosphatase: 63 IU/L (ref 39–117)
BUN/Creatinine Ratio: 23 (ref 9–23)
BUN: 14 mg/dL (ref 6–24)
Bilirubin Total: 0.4 mg/dL (ref 0.0–1.2)
CALCIUM: 10.3 mg/dL — AB (ref 8.7–10.2)
CO2: 26 mmol/L (ref 18–29)
Chloride: 94 mmol/L — ABNORMAL LOW (ref 96–106)
Creatinine, Ser: 0.61 mg/dL (ref 0.57–1.00)
GFR, EST AFRICAN AMERICAN: 121 mL/min/{1.73_m2} (ref >59)
GFR, EST NON AFRICAN AMERICAN: 105 mL/min/{1.73_m2} (ref >59)
GLOBULIN, TOTAL: 2.9 g/dL (ref 1.5–4.5)
Glucose: 165 mg/dL — ABNORMAL HIGH (ref 65–99)
POTASSIUM: 4.3 mmol/L (ref 3.5–5.2)
SODIUM: 137 mmol/L (ref 134–144)
TOTAL PROTEIN: 7.2 g/dL (ref 6.0–8.5)

## 2017-02-08 LAB — TSH: TSH: 0.56 u[IU]/mL (ref 0.450–4.500)

## 2017-02-08 LAB — AMBIG ABBREV CMP14 DEFAULT

## 2017-02-08 LAB — T4, FREE: Free T4: 1.68 ng/dL (ref 0.82–1.77)

## 2017-02-08 LAB — HGB A1C W/O EAG: Hgb A1c MFr Bld: 8.2 % — ABNORMAL HIGH (ref 4.8–5.6)

## 2017-02-17 ENCOUNTER — Ambulatory Visit (INDEPENDENT_AMBULATORY_CARE_PROVIDER_SITE_OTHER): Payer: 59 | Admitting: "Endocrinology

## 2017-02-17 ENCOUNTER — Encounter: Payer: Self-pay | Admitting: "Endocrinology

## 2017-02-17 VITALS — BP 132/81 | HR 98 | Ht 67.0 in | Wt 367.0 lb

## 2017-02-17 DIAGNOSIS — I10 Essential (primary) hypertension: Secondary | ICD-10-CM

## 2017-02-17 DIAGNOSIS — E890 Postprocedural hypothyroidism: Secondary | ICD-10-CM

## 2017-02-17 DIAGNOSIS — IMO0001 Reserved for inherently not codable concepts without codable children: Secondary | ICD-10-CM

## 2017-02-17 DIAGNOSIS — E1165 Type 2 diabetes mellitus with hyperglycemia: Secondary | ICD-10-CM | POA: Diagnosis not present

## 2017-02-17 NOTE — Progress Notes (Signed)
Subjective:    Patient ID: Sharon Garcia, female    DOB: [birth year], PCP Sharon Herring, MD   Past Medical History:  Diagnosis Date  . Asthma   . Bronchitis   . Diabetes mellitus   . Hyperlipidemia   . Hypertension   . Hyperthyroidism    s/p radiation  . Lymphedema    Past Surgical History:  Procedure Laterality Date  . thyroid radiation     Social History   Social History  . Marital status: Married    Spouse name: Sharon Garcia  . Number of children: 1  . Years of education: 79   Occupational History  . 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 Smoking status: Former Smoker    Packs/day: 0.30    Years: 10.00    Types: Cigarettes    Quit date: 01/05/2005  . Smokeless tobacco: Never Used  . Alcohol use No  . Drug use: No  . Sexual activity: Not Asked   Other Topics Concern  . None   Social History Narrative   Married.  Lives in [place] with husband.  Independent of ADLs.   Outpatient Encounter Prescriptions as of 02/17/2017  Medication Sig  . aspirin EC 81 MG tablet Take 81 mg by mouth daily.  . canagliflozin (INVOKANA) 100 MG TABS tablet TAKE 1 TABLET BY MOUTH  DAILY BEFORE BREAKFAST  . citalopram (CELEXA) 20 MG tablet Take 20 mg by mouth daily.  Marland Kitchen diltiazem (DILACOR XR) 180 MG 24 hr capsule Take 180 mg by mouth daily.  . furosemide (LASIX) 20 MG tablet Take 20 mg by mouth 1 day or 1 dose.  . hydrochlorothiazide (HYDRODIURIL) 25 MG tablet TAKE 1 TABLET (25 MG TOTAL) BY MOUTH DAILY.  Marland Kitchen levothyroxine (SYNTHROID, LEVOTHROID) 150 MCG tablet Take 1 tablet (150 mcg total) by mouth daily before breakfast.  . meloxicam (MOBIC) 15 MG tablet Take 15 mg by mouth daily.  . metFORMIN (GLUCOPHAGE) 1000 MG tablet Take 1 tablet (1,000 mg total) by mouth 2 (two) times daily with a meal.  . omeprazole (PRILOSEC) 40 MG capsule Take 40 mg by mouth daily.  Marland Kitchen POTASSIUM PO Take by mouth. Over the counter   No facility-administered encounter medications on file as  of 02/17/2017.    ALLERGIES: No Known Allergies VACCINATION STATUS:  There is no immunization history on file for this patient.  HPI  Sharon Garcia is a 51 - yr-old patient with medical history as above. She is here to f/u for RAI hypothyroidism , type 2 DM, HTN,  Obesity.  she is on Lt4 150 mcg po qam. She is compliant.. For her diabetes she is taking metformin and Invokana , she has had better consistency on her Invokana. - Since her last visit, she lost about 8  Pounds since her last visit. Pt denies family history of thyroid dysfunction . She denies personal history of goiter.  Review of Systems  Constitutional: Lost 8 pounds, no fatigue, no subjective hyperthermia/hypothermia Eyes: no blurry vision, no xerophthalmia ENT: no sore throat, no nodules palpated in throat, no dysphagia/odynophagia, no hoarseness Cardiovascular: no CP/SOB/palpitations/leg swelling Respiratory: no cough/SOB Gastrointestinal: no N/V/D/C Musculoskeletal: She now uses crutches due to large extremities from lymphedema. Skin: no rashes Neurological: no tremors/numbness/tingling/dizziness Psychiatric: no depression/anxiety  Objective:    BP 132/81   Pulse 98   Ht 5\' 7"  (1.702 m)   Wt (!) 367 lb (166.5 kg)   BMI 57.48 kg/m   Wt Readings  from Last 3 Encounters:  02/17/17 (!) 367 lb (166.5 kg)  11/14/16 (!) 375 lb (170.1 kg)  08/20/16 (!) 368 lb (166.9 kg)    Physical Exam  Constitutional: obese ,  in NAD. Walks with crutches. Eyes: PERRLA, EOMI, no exophthalmos ENT: moist mucous membranes, no thyromegaly, no cervical lymphadenopathy Cardiovascular: RRR, No MRG Respiratory: CTA B Gastrointestinal: abdomen soft, NT, ND, BS+ Musculoskeletal: She has bilateral large extremities due to lymphedema., No focal deficits..  Skin: moist, warm, no rashes Neurological: no tremor with outstretched hands.    Recent Results (from the past 2160 hour(s))  Comprehensive metabolic panel     Status: Abnormal    Collection Time: 02/07/17  2:27 PM  Result Value Ref Range   Glucose 165 (H) 65 - 99 mg/dL   BUN 14 6 - 24 mg/dL   Creatinine, Ser 0.61 0.57 - 1.00 mg/dL   GFR calc non Af Amer 105 >59 mL/min/1.73   GFR calc Af Amer 121 >59 mL/min/1.73   BUN/Creatinine Ratio 23 9 - 23   Sodium 137 134 - 144 mmol/L   Potassium 4.3 3.5 - 5.2 mmol/L   Chloride 94 (L) 96 - 106 mmol/L   CO2 26 18 - 29 mmol/L   Calcium 10.3 (H) 8.7 - 10.2 mg/dL   Total Protein 7.2 6.0 - 8.5 g/dL   Albumin 4.3 3.5 - 5.5 g/dL   Globulin, Total 2.9 1.5 - 4.5 g/dL   Albumin/Globulin Ratio 1.5 1.2 - 2.2   Bilirubin Total 0.4 0.0 - 1.2 mg/dL   Alkaline Phosphatase 63 39 - 117 IU/L   AST 22 0 - 40 IU/L   ALT 24 0 - 32 IU/L  Hgb A1c w/o eAG     Status: Abnormal   Collection Time: 02/07/17  2:27 PM  Result Value Ref Range   Hgb A1c MFr Bld 8.2 (H) 4.8 - 5.6 %    Comment:          Pre-diabetes: 5.7 - 6.4          Diabetes: >6.4          Glycemic control for adults with diabetes: <7.0   T4, free     Status: None   Collection Time: 02/07/17  2:27 PM  Result Value Ref Range   Free T4 1.68 0.82 - 1.77 ng/dL  TSH     Status: None   Collection Time: 02/07/17  2:27 PM  Result Value Ref Range   TSH 0.560 0.450 - 4.500 uIU/mL  Ambig Abbrev CMP14 Default     Status: None   Collection Time: 02/07/17  2:27 PM  Result Value Ref Range   Ambig Abbrev CMP14 Default Comment     Comment: A hand-written panel/profile was received from your office. In accordance with the [facility] Ambiguous Test Code Policy dated July 2505, we have completed your order by using the closest currently or formerly recognized AMA panel.  We have assigned Comprehensive Metabolic Panel (14), Test Code #322000 to this request.  If this is not the testing you wished to receive on this specimen, please contact the [facility] Client Inquiry/Technical Services Department to clarify the test order.  We appreciate your business.      Assessment & Plan:   1.  RAI  induced Hypothyroidism   - Her thyroid function tests are consistent with appropriate replacement. - She will continue on levothyroxine 150 g by mouth every morning.   - We discussed about correct intake of levothyroxine, at fasting, with water,  separated by at least 30 minutes from breakfast, and separated by more than 4 hours from calcium, iron, multivitamins, acid reflux medications (PPIs). -Patient is made aware of the fact that thyroid hormone replacement is needed for life, dose to be adjusted by periodic monitoring of thyroid function tests.   2. Uncontrolled type 2 diabetes mellitus   -Her A1c stays high at 8.2%, generally increasing from 7.1%. This is due to significant indiscretion in her soda consumption  I have re-counseled her on carbs restriction.  -Continue metformin 1000 mg by mouth twice a day, and Invokana 100 mg by mouth once a day. I will arrange for consult with the CDE.  3. Essential hypertension  -Uncontrolled however better than last visit. Continue hydrochlorothiazide 25 mg by mouth daily and diltiazem  180 mg XR by mouth daily.  4. Morbid obesity due to excess calories [facility]) -This is a chronic problem for her. I gave her detailed carbohydrates information. She has physical limitations for optimal exercise. I will send her to a dietitian for consult.  - 25 minutes of time was spent on the care of this patient , 50% of which was applied for counseling on diabetes complications and their preventions.  - I advised patient to maintain close follow up with Sharon Herring, MD for primary care needs. Follow up plan: Return in about 3 months (around 05/19/2017) for follow up with pre-visit labs.  Glade Lloyd, MD Phone: [phone]  Fax: [phone]   02/17/2017, 4:04 PM

## 2017-02-17 NOTE — Patient Instructions (Signed)

## 2017-03-10 ENCOUNTER — Telehealth: Payer: Self-pay | Admitting: "Endocrinology

## 2017-03-10 MED ORDER — FLUCONAZOLE 150 MG PO TABS: 150.0000 mg | ORAL_TABLET | Freq: Once | ORAL | 0 refills | Status: AC

## 2017-03-10 NOTE — Telephone Encounter (Signed)
Sharon Garcia is calling asking if Dr. Dorris Fetch would call her in something for a yeast infection, please advise?

## 2017-03-27 ENCOUNTER — Telehealth: Payer: Self-pay | Admitting: "Endocrinology

## 2017-03-27 MED ORDER — LEVOTHYROXINE SODIUM 150 MCG PO TABS: 150.0000 ug | ORAL_TABLET | Freq: Every day | ORAL | 0 refills | Status: AC

## 2017-03-27 NOTE — Telephone Encounter (Signed)
Sharon Garcia is asking for a refill on levothyroxine (SYNTHROID, LEVOTHROID) 150 MCG tablet called to [facility] in [place]

## 2017-04-10 DIAGNOSIS — Z1389 Encounter for screening for other disorder: Secondary | ICD-10-CM | POA: Diagnosis not present

## 2017-04-10 DIAGNOSIS — E118 Type 2 diabetes mellitus with unspecified complications: Secondary | ICD-10-CM | POA: Diagnosis not present

## 2017-04-10 DIAGNOSIS — E119 Type 2 diabetes mellitus without complications: Secondary | ICD-10-CM | POA: Diagnosis not present

## 2017-04-10 DIAGNOSIS — I890 Lymphedema, not elsewhere classified: Secondary | ICD-10-CM | POA: Diagnosis not present

## 2017-05-06 DIAGNOSIS — E1165 Type 2 diabetes mellitus with hyperglycemia: Secondary | ICD-10-CM | POA: Diagnosis not present

## 2017-05-06 DIAGNOSIS — E890 Postprocedural hypothyroidism: Secondary | ICD-10-CM | POA: Diagnosis not present

## 2017-05-06 DIAGNOSIS — E782 Mixed hyperlipidemia: Secondary | ICD-10-CM | POA: Diagnosis not present

## 2017-05-07 ENCOUNTER — Other Ambulatory Visit (HOSPITAL_COMMUNITY): Payer: Self-pay | Admitting: Family Medicine

## 2017-05-07 DIAGNOSIS — Z1231 Encounter for screening mammogram for malignant neoplasm of breast: Secondary | ICD-10-CM

## 2017-05-17 DIAGNOSIS — H40033 Anatomical narrow angle, bilateral: Secondary | ICD-10-CM | POA: Diagnosis not present

## 2017-05-17 DIAGNOSIS — E119 Type 2 diabetes mellitus without complications: Secondary | ICD-10-CM | POA: Diagnosis not present

## 2017-05-20 DIAGNOSIS — E782 Mixed hyperlipidemia: Secondary | ICD-10-CM | POA: Diagnosis not present

## 2017-05-20 DIAGNOSIS — E1165 Type 2 diabetes mellitus with hyperglycemia: Secondary | ICD-10-CM | POA: Diagnosis not present

## 2017-05-20 DIAGNOSIS — E1142 Type 2 diabetes mellitus with diabetic polyneuropathy: Secondary | ICD-10-CM | POA: Diagnosis not present

## 2017-05-24 ENCOUNTER — Other Ambulatory Visit: Payer: Self-pay | Admitting: Cardiovascular Disease

## 2017-05-26 NOTE — Telephone Encounter (Signed)
Please review for refill, Thanks !  

## 2017-05-28 ENCOUNTER — Telehealth: Payer: Self-pay | Admitting: Radiology

## 2017-05-30 DIAGNOSIS — L97529 Non-pressure chronic ulcer of other part of left foot with unspecified severity: Secondary | ICD-10-CM | POA: Diagnosis not present

## 2017-05-30 DIAGNOSIS — E1142 Type 2 diabetes mellitus with diabetic polyneuropathy: Secondary | ICD-10-CM | POA: Diagnosis not present

## 2017-06-09 ENCOUNTER — Other Ambulatory Visit: Payer: Self-pay | Admitting: "Endocrinology

## 2017-06-16 DIAGNOSIS — E782 Mixed hyperlipidemia: Secondary | ICD-10-CM | POA: Diagnosis not present

## 2017-06-16 DIAGNOSIS — E1165 Type 2 diabetes mellitus with hyperglycemia: Secondary | ICD-10-CM | POA: Diagnosis not present

## 2017-06-17 DIAGNOSIS — E782 Mixed hyperlipidemia: Secondary | ICD-10-CM | POA: Diagnosis not present

## 2017-06-17 DIAGNOSIS — E1142 Type 2 diabetes mellitus with diabetic polyneuropathy: Secondary | ICD-10-CM | POA: Diagnosis not present

## 2017-06-17 DIAGNOSIS — E1165 Type 2 diabetes mellitus with hyperglycemia: Secondary | ICD-10-CM | POA: Diagnosis not present

## 2017-06-21 ENCOUNTER — Other Ambulatory Visit: Payer: Self-pay | Admitting: "Endocrinology

## 2017-06-23 ENCOUNTER — Ambulatory Visit: Payer: 59 | Admitting: "Endocrinology

## 2017-06-25 ENCOUNTER — Other Ambulatory Visit: Payer: Self-pay | Admitting: "Endocrinology

## 2017-07-11 DIAGNOSIS — E1142 Type 2 diabetes mellitus with diabetic polyneuropathy: Secondary | ICD-10-CM | POA: Diagnosis not present

## 2017-07-11 DIAGNOSIS — H04123 Dry eye syndrome of bilateral lacrimal glands: Secondary | ICD-10-CM | POA: Diagnosis not present

## 2017-07-11 DIAGNOSIS — L851 Acquired keratosis [keratoderma] palmaris et plantaris: Secondary | ICD-10-CM | POA: Diagnosis not present

## 2017-07-11 DIAGNOSIS — L609 Nail disorder, unspecified: Secondary | ICD-10-CM | POA: Diagnosis not present

## 2017-07-11 DIAGNOSIS — R234 Changes in skin texture: Secondary | ICD-10-CM | POA: Diagnosis not present

## 2017-08-11 ENCOUNTER — Other Ambulatory Visit: Payer: Self-pay | Admitting: "Endocrinology

## 2017-08-15 DIAGNOSIS — E1142 Type 2 diabetes mellitus with diabetic polyneuropathy: Secondary | ICD-10-CM | POA: Diagnosis not present

## 2017-08-15 DIAGNOSIS — L97529 Non-pressure chronic ulcer of other part of left foot with unspecified severity: Secondary | ICD-10-CM | POA: Diagnosis not present

## 2017-08-15 DIAGNOSIS — E782 Mixed hyperlipidemia: Secondary | ICD-10-CM | POA: Diagnosis not present

## 2017-08-15 DIAGNOSIS — E1165 Type 2 diabetes mellitus with hyperglycemia: Secondary | ICD-10-CM | POA: Diagnosis not present

## 2017-08-20 ENCOUNTER — Other Ambulatory Visit: Payer: Self-pay | Admitting: Cardiovascular Disease

## 2017-08-20 NOTE — Telephone Encounter (Signed)
Please review for refill, thanks ! 

## 2017-09-24 ENCOUNTER — Other Ambulatory Visit: Payer: Self-pay

## 2017-09-24 MED ORDER — HYDROCHLOROTHIAZIDE 25 MG PO TABS: 25.0000 mg | ORAL_TABLET | Freq: Every day | ORAL | 0 refills | Status: DC

## 2017-10-24 DIAGNOSIS — E1142 Type 2 diabetes mellitus with diabetic polyneuropathy: Secondary | ICD-10-CM | POA: Diagnosis not present

## 2017-10-24 DIAGNOSIS — L97529 Non-pressure chronic ulcer of other part of left foot with unspecified severity: Secondary | ICD-10-CM | POA: Diagnosis not present

## 2017-11-03 ENCOUNTER — Other Ambulatory Visit: Payer: Self-pay | Admitting: *Deleted

## 2017-11-03 MED ORDER — HYDROCHLOROTHIAZIDE 25 MG PO TABS: 25.0000 mg | ORAL_TABLET | Freq: Every day | ORAL | 0 refills | Status: DC

## 2017-11-05 ENCOUNTER — Other Ambulatory Visit: Payer: Self-pay

## 2017-11-05 MED ORDER — HYDROCHLOROTHIAZIDE 25 MG PO TABS: 25.0000 mg | ORAL_TABLET | Freq: Every day | ORAL | 0 refills | Status: DC

## 2017-12-12 DIAGNOSIS — E1165 Type 2 diabetes mellitus with hyperglycemia: Secondary | ICD-10-CM | POA: Diagnosis not present

## 2017-12-16 DIAGNOSIS — E1165 Type 2 diabetes mellitus with hyperglycemia: Secondary | ICD-10-CM | POA: Diagnosis not present

## 2017-12-16 DIAGNOSIS — E1142 Type 2 diabetes mellitus with diabetic polyneuropathy: Secondary | ICD-10-CM | POA: Diagnosis not present

## 2017-12-16 DIAGNOSIS — E782 Mixed hyperlipidemia: Secondary | ICD-10-CM | POA: Diagnosis not present

## 2017-12-25 ENCOUNTER — Other Ambulatory Visit: Payer: Self-pay | Admitting: Cardiovascular Disease

## 2017-12-25 NOTE — Telephone Encounter (Signed)
Please review for refill. Thanks!  

## 2018-01-02 DIAGNOSIS — E1142 Type 2 diabetes mellitus with diabetic polyneuropathy: Secondary | ICD-10-CM | POA: Diagnosis not present

## 2018-03-27 DIAGNOSIS — E782 Mixed hyperlipidemia: Secondary | ICD-10-CM | POA: Diagnosis not present

## 2018-03-27 DIAGNOSIS — E1142 Type 2 diabetes mellitus with diabetic polyneuropathy: Secondary | ICD-10-CM | POA: Diagnosis not present

## 2018-05-01 ENCOUNTER — Encounter: Payer: Self-pay | Admitting: Internal Medicine

## 2018-05-01 ENCOUNTER — Ambulatory Visit: Payer: 59 | Admitting: Internal Medicine

## 2018-05-01 ENCOUNTER — Ambulatory Visit (INDEPENDENT_AMBULATORY_CARE_PROVIDER_SITE_OTHER): Payer: 59 | Admitting: Internal Medicine

## 2018-05-01 VITALS — BP 154/74 | HR 88 | Ht 67.0 in | Wt 387.0 lb

## 2018-05-01 DIAGNOSIS — I10 Essential (primary) hypertension: Secondary | ICD-10-CM

## 2018-05-01 DIAGNOSIS — I4891 Unspecified atrial fibrillation: Secondary | ICD-10-CM | POA: Diagnosis not present

## 2018-05-01 DIAGNOSIS — I480 Paroxysmal atrial fibrillation: Secondary | ICD-10-CM

## 2018-05-01 MED ORDER — DILTIAZEM HCL ER 240 MG PO CP24: 240.0000 mg | ORAL_CAPSULE | Freq: Every day | ORAL | 3 refills | Status: AC

## 2018-05-01 NOTE — Progress Notes (Signed)
Cardiology Office Note   Date:  05/01/2018   ID:  Sharon Garcia, DOB [birth year], MRN [id number]  PCP:  Redmond School, MD  Cardiologist:   Dorris Carnes, MD    Pt referred by Dr Hilma Favors for HTN and PAF     History of Present Illness: Sharon Garcia is a 52 y.o. female with a history of Morbid obesity, paroxysmal atrial fibrillation, HTN and DM   , hyperthyroidsm.   Pt had one spell fo afib while anemic and with thyrotoxicosis.   Not on anticoagulant now   She was seen by Berneice Gandy in the past   Last seen in 2017   Stress test ordered (preop eval) but pt did not have done     Endocrinolgist suggested cardiology f/u   Pt denies CP  Breathing is OK   No palpitoans   No dizziness Pt can't remember palpitations when had afib   Said her breathing was rough.   She is not having any problems now   Pt says her thyroid levels are controlled      Current Meds  Medication Sig  . albuterol (PROAIR HFA) 108 (90 Base) MCG/ACT inhaler ProAir HFA 90 mcg/actuation aerosol inhaler  . aspirin EC 81 MG tablet Take 81 mg by mouth daily.  Marland Kitchen atorvastatin (LIPITOR) 20 MG tablet 20 mg daily.  . canagliflozin (INVOKANA) 100 MG TABS tablet TAKE 1 TABLET BY MOUTH  DAILY BEFORE BREAKFAST  . Cholecalciferol (VITAMIN D-3) 1000 units CAPS Take 1,000 Units by mouth.  . citalopram (CELEXA) 20 MG tablet Take 20 mg by mouth daily.  Marland Kitchen diltiazem (DILACOR XR) 180 MG 24 hr capsule Take 180 mg by mouth daily.  Marland Kitchen gabapentin (NEURONTIN) 600 MG tablet Take 600 mg by mouth 3 (three) times daily.  Marland Kitchen levothyroxine (SYNTHROID, LEVOTHROID) 150 MCG tablet Take 1 tablet (150 mcg total) by mouth daily before breakfast.  . metFORMIN (GLUCOPHAGE) 1000 MG tablet Take 1 tablet (1,000 mg total) by mouth 2 (two) times daily with a meal.  . omeprazole (PRILOSEC) 40 MG capsule Take 40 mg by mouth daily.  Marland Kitchen POTASSIUM PO Take by mouth. Over the counter  . [DISCONTINUED] furosemide (LASIX) 20 MG tablet Take 20 mg by mouth 1 day or 1  dose.     Allergies:   Patient has no known allergies.   Past Medical History:  Diagnosis Date  . Asthma   . Bronchitis   . Diabetes mellitus   . Hyperlipidemia   . Hypertension   . Hyperthyroidism    s/p radiation  . Lymphedema     Past Surgical History:  Procedure Laterality Date  . thyroid radiation       Social History:  The patient  reports that she quit smoking about 13 years ago. Her smoking use included cigarettes. She has a 3.00 pack-year smoking history. She has never used smokeless tobacco. She reports that she does not drink alcohol or use drugs.   Family History:  The patient's family history includes Bone cancer in her mother; Cancer in her mother; Congestive Heart Failure in her maternal grandmother; Deep vein thrombosis in her paternal grandmother; Diabetes in her father and mother; Heart attack in her father and maternal grandfather; Hypertension in her father and mother; Kidney disease in her mother; Lung cancer in her maternal grandmother; Multiple myeloma in her mother.    ROS:  Please see the history of present illness. All other systems are reviewed and  Negative to the above problem  except as noted.    PHYSICAL EXAM: VS:  BP (!) 154/74 (BP Location: Right Wrist)   Pulse 88   Ht _0  (1.702 m)   Wt (!) 175.5 kg (387 lb)   SpO2 95%   BMI 60.61 kg/m   GEN:  Morbidly obese 54 bpm  in no acute distress  HEENT: normal  Neck: no JVD, carotid bruits, or masses Cardiac: RRR; no murmurs, rubs, or gallops,  Marked lymphedema  Respiratory:  clear to auscultation bilaterally, normal work of breathing GI: soft, nontender, nondistended, + BS  No hepatomegaly  MS: no deformity Moving all extremities   Skin: warm and dry, no rash Neuro:  Strength and sensation are intact Psych: euthymic mood, full affect   EKG:  EKG is ordered today.  SR 78 bpm     Lipid Panel No results found for: CHOL, TRIG, HDL, CHOLHDL, VLDL, LDLCALC, LDLDIRECT    Wt Readings from  Last 3 Encounters:  05/01/18 (!) 175.5 kg (387 lb)  02/17/17 (!) 166.5 kg (367 lb)  11/14/16 (!) 170.1 kg (375 lb)      ASSESSMENT AND PLAN:  1  HTN   BP is elevated   WIll increase dilt to 240 mg per day   F/U in clinic in 4 to 6 wks  2  PAF   Only documented spell was when thyroid was off..  Very SOB at the time      None now   Follows in endo for thyroid WOuld not change Rx    3  Morbid obesity   Needs to lose wt.  Counselled         Current medicines are reviewed at length with the patient today.  The patient does not have concerns regarding medicines.  Signed, Dorris Carnes, MD  05/01/2018 3:40 PM    [facility] [address], [place], [place]  [address] Phone: [phone]; Fax: [phone]

## 2018-05-01 NOTE — Patient Instructions (Signed)
Your physician recommends that you schedule a follow-up appointment in: 6 weeks    INCREASE Diltiazem to 240 mg daily    No lab work or tests today.    .If you need a refill on your cardiac medications before your next appointment, please call your pharmacy.      Thank you for choosing [facility] !

## 2018-05-29 DIAGNOSIS — H40033 Anatomical narrow angle, bilateral: Secondary | ICD-10-CM | POA: Diagnosis not present

## 2018-05-29 DIAGNOSIS — E119 Type 2 diabetes mellitus without complications: Secondary | ICD-10-CM | POA: Diagnosis not present

## 2018-06-10 DIAGNOSIS — H40013 Open angle with borderline findings, low risk, bilateral: Secondary | ICD-10-CM | POA: Diagnosis not present

## 2018-06-16 ENCOUNTER — Ambulatory Visit: Payer: 59 | Admitting: Student

## 2018-06-16 NOTE — Progress Notes (Deleted)
Cardiology Office Note    Date:  06/16/2018   ID:  Sharon Garcia, DOB 05-10-1966, MRN [id number]  PCP:  Redmond School, MD  Cardiologist: Dorris Carnes, MD    No chief complaint on file.   History of Present Illness:    Sharon Garcia is a 52 y.o. female with past medical history of PAD, HTN, HLD, Hyperthyroidism (s/p radiation therapy), and Type 2 DM who presents to the office today for 6-week follow-up.  She was last examined by Dr. Harrington Challenger in 04/2018 and denied any recent chest pain or dyspnea on exertion.  While she was maintaining normal sinus rhythm, BP was elevated to 154/74.  Therefore, diltiazem was increased to 240 mg daily and she was informed to follow-up in 4 to 6 weeks.   Past Medical History:  Diagnosis Date  . Asthma   . Bronchitis   . Diabetes mellitus   . Hyperlipidemia   . Hypertension   . Hyperthyroidism    s/p radiation  . Lymphedema     Past Surgical History:  Procedure Laterality Date  . thyroid radiation      Current Medications: Outpatient Medications Prior to Visit  Medication Sig Dispense Refill  . albuterol (PROAIR HFA) 108 (90 Base) MCG/ACT inhaler ProAir HFA 90 mcg/actuation aerosol inhaler    . aspirin EC 81 MG tablet Take 81 mg by mouth daily.    Marland Kitchen atorvastatin (LIPITOR) 20 MG tablet 20 mg daily.    . canagliflozin (INVOKANA) 100 MG TABS tablet TAKE 1 TABLET BY MOUTH  DAILY BEFORE BREAKFAST 90 tablet 0  . Cholecalciferol (VITAMIN D-3) 1000 units CAPS Take 1,000 Units by mouth.    . citalopram (CELEXA) 20 MG tablet Take 20 mg by mouth daily.    Marland Kitchen diltiazem (DILACOR XR) 240 MG 24 hr capsule Take 1 capsule (240 mg total) by mouth daily. 90 capsule 3  . gabapentin (NEURONTIN) 600 MG tablet Take 600 mg by mouth 3 (three) times daily.    Marland Kitchen levothyroxine (SYNTHROID, LEVOTHROID) 150 MCG tablet Take 1 tablet (150 mcg total) by mouth daily before breakfast. 90 tablet 0  . metFORMIN (GLUCOPHAGE) 1000 MG tablet Take 1 tablet (1,000 mg total) by mouth 2  (two) times daily with a meal. 180 tablet 3  . omeprazole (PRILOSEC) 40 MG capsule Take 40 mg by mouth daily.    Marland Kitchen POTASSIUM PO Take by mouth. Over the counter     No facility-administered medications prior to visit.      Allergies:   Patient has no known allergies.   Social History   Socioeconomic History  . Marital status: Married    Spouse name: Juleen China  . Number of children: 1  . Years of education: 83  . Highest education level: Not on file  Occupational History  . Occupation: Careers adviser: [employer]  . Financial resource strain: Not on file  . Food insecurity:    Worry: Not on file    Inability: Not on file  . Transportation needs:    Medical: Not on file    Non-medical: Not on file  Tobacco Use  . Smoking status: Former Smoker    Packs/day: 0.30    Years: 10.00    Pack years: 3.00    Types: Cigarettes    Last attempt to quit: 01/05/2005    Years since quitting: 13.4  . Smokeless tobacco: Never Used  Substance and Sexual Activity  . Alcohol use: No  Alcohol/week: 0.0 oz  . Drug use: No  . Sexual activity: Not on file  Lifestyle  . Physical activity:    Days per week: Not on file    Minutes per session: Not on file  . Stress: Not on file  Relationships  . Social connections:    Talks on phone: Not on file    Gets together: Not on file    Attends religious service: Not on file    Active member of club or organization: Not on file    Attends meetings of clubs or organizations: Not on file    Relationship status: Not on file  Other Topics Concern  . Not on file  Social History Narrative   Married.  Lives in [place] with husband.  Independent of ADLs.     Family History:  The patient's ***family history includes Bone cancer in her mother; Cancer in her mother; Congestive Heart Failure in her maternal grandmother; Deep vein thrombosis in her paternal grandmother; Diabetes in her father and mother; Heart attack in her father  and maternal grandfather; Hypertension in her father and mother; Kidney disease in her mother; Lung cancer in her maternal grandmother; Multiple myeloma in her mother.   Review of Systems:   Please see the history of present illness.     General:  No chills, fever, night sweats or weight changes.  Cardiovascular:  No chest pain, dyspnea on exertion, edema, orthopnea, palpitations, paroxysmal nocturnal dyspnea. Dermatological: No rash, lesions/masses Respiratory: No cough, dyspnea Urologic: No hematuria, dysuria Abdominal:   No nausea, vomiting, diarrhea, bright red blood per rectum, melena, or hematemesis Neurologic:  No visual changes, wkns, changes in mental status. All other systems reviewed and are otherwise negative except as noted above.   Physical Exam:    VS:  There were no vitals taken for this visit.   General: Well developed, well nourished,female appearing in no acute distress. Head: Normocephalic, atraumatic, sclera non-icteric, no xanthomas, nares are without discharge.  Neck: No carotid bruits. JVD not elevated.  Lungs: Respirations regular and unlabored, without wheezes or rales.  Heart: ***Regular rate and rhythm. No S3 or S4.  No murmur, no rubs, or gallops appreciated. Abdomen: Soft, non-tender, non-distended with normoactive bowel sounds. No hepatomegaly. No rebound/guarding. No obvious abdominal masses. Msk:  Strength and tone appear normal for age. No joint deformities or effusions. Extremities: No clubbing or cyanosis. No edema.  Distal pedal pulses are 2+ bilaterally. Neuro: Alert and oriented X 3. Moves all extremities spontaneously. No focal deficits noted. Psych:  Responds to questions appropriately with a normal affect. Skin: No rashes or lesions noted  Wt Readings from Last 3 Encounters:  05/01/18 (!) 387 lb (175.5 kg)  02/17/17 (!) 367 lb (166.5 kg)  11/14/16 (!) 375 lb (170.1 kg)        Studies/Labs Reviewed:   EKG:  EKG is*** ordered today.   The ekg ordered today demonstrates ***  Recent Labs: No results found for requested labs within last 8760 hours.   Lipid Panel No results found for: CHOL, TRIG, HDL, CHOLHDL, VLDL, LDLCALC, LDLDIRECT  Additional studies/ records that were reviewed today include:   Echocardiogram: 07/2015 Study Conclusions  - Left ventricle: The cavity size was normal. Wall thickness was   increased in a pattern of mild LVH. Systolic function was normal.   The estimated ejection fraction was in the range of 60% to 65%.   Wall motion was normal; there were no regional wall motion   abnormalities. Features  are consistent with a pseudonormal left   ventricular filling pattern, with concomitant abnormal relaxation   and increased filling pressure (grade 2 diastolic dysfunction). - Aortic valve: Trileaflet; mildly calcified leaflets. - Mitral valve: Fairly heavy calcification of the posterior mitral   annulus, appears more focal and linear however. Somewhat   associated with the posterior leaflet. Does not result in obvious   mitral stenosis. Given unusual appearance for typical MAC,   consider further imaging with TEE or possibly cardiac MRI to   better characterize. - Left atrium: The atrium was at the upper limits of normal in   size. - Tricuspid valve: There was trivial regurgitation. - Pulmonary arteries: Systolic pressure could not be accurately   estimated. - Pericardium, extracardiac: A trivial pericardial effusion was   identified.  Impressions:  - Mild LVH with LVEF 60-65% and grade 2 diastolic dysfunction.   Upper normal left atrial chamber size. Fairly heavy calcification   of the posterior mitral annulus, appears more focal and linear   however. Somewhat associated with the posterior leaflet. Does not   result in obvious mitral stenosis. Given unusual appearance for   typical MAC, consider further imaging with TEE or possibly   cardiac MRI to better characterize. Trivial mitral  and tricuspid   regurgitation. Unable to assess PASP. Trivial pericardial   effusion.  Assessment:    No diagnosis found.   Plan:   In order of problems listed above:  1. ***    Medication Adjustments/Labs and Tests Ordered: Current medicines are reviewed at length with the patient today.  Concerns regarding medicines are outlined above.  Medication changes, Labs and Tests ordered today are listed in the Patient Instructions below. There are no Patient Instructions on file for this visit.   Signed, Erma Heritage, PA-C  06/16/2018 12:55 PM    [place] [address] [place], [place] [address] Phone: [phone]

## 2018-06-17 ENCOUNTER — Encounter: Payer: Self-pay | Admitting: Student

## 2018-06-23 DIAGNOSIS — L97522 Non-pressure chronic ulcer of other part of left foot with fat layer exposed: Secondary | ICD-10-CM | POA: Diagnosis not present

## 2018-06-23 DIAGNOSIS — E1142 Type 2 diabetes mellitus with diabetic polyneuropathy: Secondary | ICD-10-CM | POA: Diagnosis not present

## 2018-06-30 DIAGNOSIS — E1142 Type 2 diabetes mellitus with diabetic polyneuropathy: Secondary | ICD-10-CM | POA: Diagnosis not present

## 2018-06-30 DIAGNOSIS — L97522 Non-pressure chronic ulcer of other part of left foot with fat layer exposed: Secondary | ICD-10-CM | POA: Diagnosis not present

## 2018-07-07 DIAGNOSIS — L97522 Non-pressure chronic ulcer of other part of left foot with fat layer exposed: Secondary | ICD-10-CM | POA: Diagnosis not present

## 2018-07-07 DIAGNOSIS — E1142 Type 2 diabetes mellitus with diabetic polyneuropathy: Secondary | ICD-10-CM | POA: Diagnosis not present

## 2018-07-16 DIAGNOSIS — I890 Lymphedema, not elsewhere classified: Secondary | ICD-10-CM | POA: Diagnosis not present

## 2018-07-16 DIAGNOSIS — J4540 Moderate persistent asthma, uncomplicated: Secondary | ICD-10-CM | POA: Diagnosis not present

## 2018-07-16 DIAGNOSIS — E039 Hypothyroidism, unspecified: Secondary | ICD-10-CM | POA: Diagnosis not present

## 2018-07-16 DIAGNOSIS — Z1389 Encounter for screening for other disorder: Secondary | ICD-10-CM | POA: Diagnosis not present

## 2018-07-16 DIAGNOSIS — I4891 Unspecified atrial fibrillation: Secondary | ICD-10-CM | POA: Diagnosis not present

## 2018-07-21 DIAGNOSIS — L97522 Non-pressure chronic ulcer of other part of left foot with fat layer exposed: Secondary | ICD-10-CM | POA: Diagnosis not present

## 2018-07-21 DIAGNOSIS — E1142 Type 2 diabetes mellitus with diabetic polyneuropathy: Secondary | ICD-10-CM | POA: Diagnosis not present

## 2018-07-28 DIAGNOSIS — E1142 Type 2 diabetes mellitus with diabetic polyneuropathy: Secondary | ICD-10-CM | POA: Diagnosis not present

## 2018-07-28 DIAGNOSIS — L97522 Non-pressure chronic ulcer of other part of left foot with fat layer exposed: Secondary | ICD-10-CM | POA: Diagnosis not present

## 2018-08-11 DIAGNOSIS — E1142 Type 2 diabetes mellitus with diabetic polyneuropathy: Secondary | ICD-10-CM | POA: Diagnosis not present

## 2018-08-11 DIAGNOSIS — L97522 Non-pressure chronic ulcer of other part of left foot with fat layer exposed: Secondary | ICD-10-CM | POA: Diagnosis not present

## 2018-08-25 DIAGNOSIS — L97522 Non-pressure chronic ulcer of other part of left foot with fat layer exposed: Secondary | ICD-10-CM | POA: Diagnosis not present

## 2018-09-08 DIAGNOSIS — E1142 Type 2 diabetes mellitus with diabetic polyneuropathy: Secondary | ICD-10-CM | POA: Diagnosis not present

## 2018-09-08 DIAGNOSIS — L97522 Non-pressure chronic ulcer of other part of left foot with fat layer exposed: Secondary | ICD-10-CM | POA: Diagnosis not present

## 2018-09-09 ENCOUNTER — Other Ambulatory Visit: Payer: Self-pay

## 2018-09-09 ENCOUNTER — Emergency Department (HOSPITAL_COMMUNITY)
Admission: EM | Admit: 2018-09-09 | Discharge: 2018-09-09 | Disposition: A | Discharge status: Home / Self Care | Payer: 59 | Attending: Emergency Medicine | Admitting: Emergency Medicine

## 2018-09-09 ENCOUNTER — Encounter (HOSPITAL_COMMUNITY): Payer: Self-pay | Admitting: Emergency Medicine

## 2018-09-09 ENCOUNTER — Emergency Department (HOSPITAL_COMMUNITY): Payer: 59

## 2018-09-09 DIAGNOSIS — Z79899 Other long term (current) drug therapy: Secondary | ICD-10-CM | POA: Insufficient documentation

## 2018-09-09 DIAGNOSIS — E119 Type 2 diabetes mellitus without complications: Secondary | ICD-10-CM | POA: Diagnosis not present

## 2018-09-09 DIAGNOSIS — Z7984 Long term (current) use of oral hypoglycemic drugs: Secondary | ICD-10-CM | POA: Insufficient documentation

## 2018-09-09 DIAGNOSIS — E039 Hypothyroidism, unspecified: Secondary | ICD-10-CM | POA: Diagnosis not present

## 2018-09-09 DIAGNOSIS — M79605 Pain in left leg: Secondary | ICD-10-CM | POA: Diagnosis not present

## 2018-09-09 DIAGNOSIS — R0602 Shortness of breath: Secondary | ICD-10-CM | POA: Insufficient documentation

## 2018-09-09 DIAGNOSIS — M7989 Other specified soft tissue disorders: Secondary | ICD-10-CM | POA: Diagnosis not present

## 2018-09-09 DIAGNOSIS — Z87891 Personal history of nicotine dependence: Secondary | ICD-10-CM | POA: Diagnosis not present

## 2018-09-09 DIAGNOSIS — I10 Essential (primary) hypertension: Secondary | ICD-10-CM | POA: Diagnosis not present

## 2018-09-09 DIAGNOSIS — I890 Lymphedema, not elsewhere classified: Secondary | ICD-10-CM | POA: Diagnosis not present

## 2018-09-09 DIAGNOSIS — E1140 Type 2 diabetes mellitus with diabetic neuropathy, unspecified: Secondary | ICD-10-CM | POA: Diagnosis not present

## 2018-09-09 DIAGNOSIS — Z7982 Long term (current) use of aspirin: Secondary | ICD-10-CM | POA: Diagnosis not present

## 2018-09-09 DIAGNOSIS — J45909 Unspecified asthma, uncomplicated: Secondary | ICD-10-CM | POA: Diagnosis not present

## 2018-09-09 DIAGNOSIS — I4891 Unspecified atrial fibrillation: Secondary | ICD-10-CM | POA: Diagnosis not present

## 2018-09-09 DIAGNOSIS — R2242 Localized swelling, mass and lump, left lower limb: Secondary | ICD-10-CM | POA: Diagnosis present

## 2018-09-09 DIAGNOSIS — L03116 Cellulitis of left lower limb: Secondary | ICD-10-CM | POA: Diagnosis not present

## 2018-09-09 LAB — CBC WITH DIFFERENTIAL/PLATELET
ABS IMMATURE GRANULOCYTES: 0.09 10*3/uL — AB (ref 0.00–0.07)
BASOS ABS: 0.1 10*3/uL (ref 0.0–0.1)
BASOS PCT: 1 %
EOS PCT: 2 %
Eosinophils Absolute: 0.3 10*3/uL (ref 0.0–0.5)
HCT: 41.2 % (ref 36.0–46.0)
HEMOGLOBIN: 12.7 g/dL (ref 12.0–15.0)
Immature Granulocytes: 1 %
LYMPHS PCT: 11 %
Lymphs Abs: 1.9 10*3/uL (ref 0.7–4.0)
MCH: 27.3 pg (ref 26.0–34.0)
MCHC: 30.8 g/dL (ref 30.0–36.0)
MCV: 88.6 fL (ref 80.0–100.0)
Monocytes Absolute: 0.7 10*3/uL (ref 0.1–1.0)
Monocytes Relative: 4 %
NEUTROS ABS: 13.8 10*3/uL — AB (ref 1.7–7.7)
NRBC: 0 % (ref 0.0–0.2)
Neutrophils Relative %: 81 %
PLATELETS: 402 10*3/uL — AB (ref 150–400)
RBC: 4.65 MIL/uL (ref 3.87–5.11)
RDW: 14.3 % (ref 11.5–15.5)
WBC: 17 10*3/uL — ABNORMAL HIGH (ref 4.0–10.5)

## 2018-09-09 LAB — COMPREHENSIVE METABOLIC PANEL
ALBUMIN: 3.6 g/dL (ref 3.5–5.0)
ALT: 13 U/L (ref 0–44)
ANION GAP: 11 (ref 5–15)
AST: 13 U/L — ABNORMAL LOW (ref 15–41)
Alkaline Phosphatase: 59 U/L (ref 38–126)
BUN: 15 mg/dL (ref 6–20)
CALCIUM: 9.6 mg/dL (ref 8.9–10.3)
CHLORIDE: 99 mmol/L (ref 98–111)
CO2: 25 mmol/L (ref 22–32)
Creatinine, Ser: 0.81 mg/dL (ref 0.44–1.00)
GFR calc Af Amer: >60 mL/min (ref >60)
GFR calc non Af Amer: >60 mL/min (ref >60)
GLUCOSE: 205 mg/dL — AB (ref 70–99)
POTASSIUM: 4.2 mmol/L (ref 3.5–5.1)
SODIUM: 135 mmol/L (ref 135–145)
Total Bilirubin: 1 mg/dL (ref 0.3–1.2)
Total Protein: 8.7 g/dL — ABNORMAL HIGH (ref 6.5–8.1)

## 2018-09-09 LAB — BRAIN NATRIURETIC PEPTIDE: B Natriuretic Peptide: 44 pg/mL (ref 0.0–100.0)

## 2018-09-09 MED ORDER — DOXYCYCLINE HYCLATE 100 MG PO CAPS: 100.0000 mg | ORAL_CAPSULE | Freq: Two times a day (BID) | ORAL | 0 refills | Status: AC

## 2018-09-09 MED ORDER — SODIUM CHLORIDE 0.9 % IV BOLUS
1000.0000 mL | Freq: Once | INTRAVENOUS | Status: AC
  Administered 2018-09-09: 1000 mL via INTRAVENOUS

## 2018-09-09 MED ORDER — SODIUM CHLORIDE 0.9 % IV SOLN
INTRAVENOUS | Status: DC | PRN
  Administered 2018-09-09: 250 mL via INTRAVENOUS

## 2018-09-09 MED ORDER — VANCOMYCIN HCL IN DEXTROSE 1-5 GM/200ML-% IV SOLN
1000.0000 mg | Freq: Once | INTRAVENOUS | Status: AC
  Administered 2018-09-09: 1000 mg via INTRAVENOUS
  Filled 2018-09-09: qty 200

## 2018-09-09 NOTE — ED Notes (Signed)
Pt reports IV burning. Vancomycin stopped and IV discontinued. Iv attempted again without success. Becky RN attempted X2 unsuccessful.

## 2018-09-09 NOTE — Discharge Instructions (Signed)
Return for the following reasons to the ER:   Increased pain or redness Increased fevers / vomiting Inability to take medicines - [place] Increased swelling  It is vital that you call your doctor for follow up in 2 days to make sure this is getting better. ER if it is getting worse  Take Doxycycline 100mg  by mouth twice daily for 10 days

## 2018-09-09 NOTE — ED Provider Notes (Signed)
[facility] EMERGENCY DEPARTMENT Provider Note   CSN: [id number] Arrival date & time: 09/09/18  1558     History   Chief Complaint Chief Complaint  Patient presents with  . Leg Swelling  . Shortness of Breath    HPI Sharon Garcia is a 52 y.o. female.  HPI  53 year old female, she has a history of diabetes, hypertension and bilateral lower extremity lymphedema.  This started 2 weeks ago - is constant and gradually worsening, it is associated with a feeling of chills but no fevers nausea vomiting or diarrhea.  She actually saw her podiatrist yesterday because of a nonhealing diabetic ulcer to her left lateral foot but did not show the doctor her leg and the redness associated with it at that time.  She comes in today with increasing pain and redness.  She denies being on recent antibiotics.  She has been performing local wound care dressings to her foot and states it is gradually improving, there is no foul smell or drainage  Past Medical History:  Diagnosis Date  . Asthma   . Bronchitis   . Diabetes mellitus   . Hyperlipidemia   . Hypertension   . Hyperthyroidism    s/p radiation  . Lymphedema     Patient Active Problem List   Diagnosis Date Noted  . Hypothyroidism following radioiodine therapy 11/02/2015  . Uncontrolled type 2 diabetes mellitus without complication, without long-term current use of insulin ([place]) 11/02/2015  . Undiagnosed cardiac murmurs 08/01/2015  . Abdominal mass 08/01/2015  . IDA (iron deficiency anemia) 08/01/2015  . Hypokalemia 01/06/2012  . Bronchitis with bronchospasm 01/06/2012  . DM (diabetes mellitus) ([place]) 01/06/2012  . Morbid obesity ([place]) 01/06/2012  . HTN (hypertension) 01/06/2012  . Depression 01/06/2012    Past Surgical History:  Procedure Laterality Date  . thyroid radiation       OB History   None      Home Medications    Prior to Admission medications   Medication Sig Start Date End Date Taking? Authorizing  Provider  albuterol (PROAIR HFA) 108 (90 Base) MCG/ACT inhaler ProAir HFA 90 mcg/actuation aerosol inhaler    [provider]  aspirin EC 81 MG tablet Take 81 mg by mouth daily.    [provider]  atorvastatin (LIPITOR) 20 MG tablet 20 mg daily. 03/31/18   [provider]  canagliflozin (INVOKANA) 100 MG TABS tablet TAKE 1 TABLET BY MOUTH  DAILY BEFORE BREAKFAST 02/03/17   Cassandria Anger, MD  Cholecalciferol (VITAMIN D-3) 1000 units CAPS Take 1,000 Units by mouth.    [provider]  citalopram (CELEXA) 20 MG tablet Take 20 mg by mouth daily.    [provider]  diltiazem (DILACOR XR) 240 MG 24 hr capsule Take 1 capsule (240 mg total) by mouth daily. 05/01/18   Fay Records, MD  doxycycline (VIBRAMYCIN) 100 MG capsule Take 1 capsule (100 mg total) by mouth 2 (two) times daily. 09/09/18   Noemi Chapel, MD  gabapentin (NEURONTIN) 600 MG tablet Take 600 mg by mouth 3 (three) times daily.    [provider]  levothyroxine (SYNTHROID, LEVOTHROID) 150 MCG tablet Take 1 tablet (150 mcg total) by mouth daily before breakfast. 03/27/17   Nida, Marella Chimes, MD  metFORMIN (GLUCOPHAGE) 1000 MG tablet Take 1 tablet (1,000 mg total) by mouth 2 (two) times daily with a meal. 09/09/16   Nida, Marella Chimes, MD  omeprazole (PRILOSEC) 40 MG capsule Take 40 mg by mouth daily.  [provider]  POTASSIUM PO Take by mouth. Over the counter    [provider]    Family History Family History  Problem Relation Age of Onset  . Kidney disease Mother   . Multiple myeloma Mother   . Diabetes Mother   . Bone cancer Mother   . Hypertension Mother   . Cancer Mother   . Diabetes Father   . Heart attack Father   . Hypertension Father   . Lung cancer Maternal Grandmother   . Congestive Heart Failure Maternal Grandmother   . Heart attack Maternal Grandfather   . Deep vein thrombosis Paternal Grandmother   . Colon cancer Neg Hx      Social History Social History   Tobacco Use  . Smoking status: Former Smoker    Packs/day: 0.30    Years: 10.00    Pack years: 3.00    Types: Cigarettes    Last attempt to quit: 01/05/2005    Years since quitting: 13.6  . Smokeless tobacco: Never Used  Substance Use Topics  . Alcohol use: No    Alcohol/week: 0.0 standard drinks  . Drug use: No     Allergies   Patient has no known allergies.   Review of Systems Review of Systems  Constitutional: Positive for chills. Negative for fever.  HENT: Negative for sore throat.   Eyes: Negative for discharge.  Respiratory: Positive for shortness of breath. Negative for cough.   Cardiovascular: Positive for leg swelling. Negative for chest pain.  Gastrointestinal: Negative for diarrhea, nausea and vomiting.  Genitourinary: Negative for dysuria.  Musculoskeletal: Negative for arthralgias.  Skin: Positive for rash.  Neurological: Negative for headaches.  Hematological: Negative for adenopathy.      Physical Exam Updated Vital Signs BP (!) 162/78 (BP Location: Right Arm)   Pulse 77   Temp 98.2 F (36.8 C) (Oral)   Resp 18   Ht 1.702 m (5' 7" )   Wt (!) 178.7 kg   LMP 09/07/2018   SpO2 98%   BMI 61.71 kg/m   Physical Exam  Constitutional: She appears well-developed and well-nourished. No distress.  HENT:  Head: Normocephalic and atraumatic.  Mouth/Throat: Oropharynx is clear and moist. No oropharyngeal exudate.  Eyes: Pupils are equal, round, and reactive to light. Conjunctivae and EOM are normal. Right eye exhibits no discharge. Left eye exhibits no discharge. No scleral icterus.  Neck: Normal range of motion. Neck supple. No JVD present. No thyromegaly present.  Cardiovascular: Normal rate, regular rhythm, normal heart sounds and intact distal pulses. Exam reveals no gallop and no friction rub.  No murmur heard. Pulmonary/Chest: Effort normal and breath sounds normal. No respiratory distress. She has no wheezes.  She has no rales.  Abdominal: Soft. Bowel sounds are normal. She exhibits no distension and no mass. There is no tenderness.  Musculoskeletal: Normal range of motion. She exhibits edema and tenderness. She exhibits no deformity.  There is significant bialteral LE edema ( pt states that L leg is normal for her), there is redness to the RLE above the foot to the mid LE below the knee.    Lymphadenopathy:    She has no cervical adenopathy.  Neurological: She is alert. Coordination normal.  Skin: Skin is warm and dry. Rash noted. There is erythema.  Open diabetic foot ulcer to the left lateral foot, no foul-smelling discharge, minimal tenderness  Psychiatric: She has a normal mood and affect. Her behavior is normal.  Nursing note and vitals reviewed.  ED Treatments / Results  Labs (all labs ordered are listed, but only abnormal results are displayed) Labs Reviewed  CBC WITH DIFFERENTIAL/PLATELET - Abnormal; Notable for the following components:      Result Value   WBC 17.0 (*)    Platelets 402 (*)    Neutro Abs 13.8 (*)    Abs Immature Granulocytes 0.09 (*)    All other components within normal limits  COMPREHENSIVE METABOLIC PANEL - Abnormal; Notable for the following components:   Glucose, Bld 205 (*)    Total Protein 8.7 (*)    AST 13 (*)    All other components within normal limits  BRAIN NATRIURETIC PEPTIDE    EKG None  Radiology Dg Chest 2 View  Result Date: 09/09/2018 CLINICAL DATA:  Swelling in the left leg. Pain and discomfort with standing. EXAM: CHEST - 2 VIEW COMPARISON:  03/28/2012 FINDINGS: Borderline enlargement of the cardiopericardial silhouette. No overt edema. The lungs appear clear. No pleural effusion. Mild thoracic spondylosis. IMPRESSION: 1. Borderline enlargement of the cardiopericardial silhouette, without edema. 2. Mild thoracic spondylosis. Electronically Signed   By: Van Clines M.D.   On: 09/09/2018 16:37   US Venous Img Lower Unilateral  Left  Result Date: 09/09/2018 CLINICAL DATA:  52 year old female with a history of leg swelling EXAM: LEFT LOWER EXTREMITY VENOUS DOPPLER ULTRASOUND TECHNIQUE: Gray-scale sonography with graded compression, as well as color Doppler and duplex ultrasound were performed to evaluate the lower extremity deep venous systems from the level of the common femoral vein and including the common femoral, femoral, profunda femoral, popliteal and calf veins including the posterior tibial, peroneal and gastrocnemius veins when visible. The superficial great saphenous vein was also interrogated. Spectral Doppler was utilized to evaluate flow at rest and with distal augmentation maneuvers in the common femoral, femoral and popliteal veins. COMPARISON:  None. FINDINGS: Contralateral Common Femoral Vein: Respiratory phasicity is normal and symmetric with the symptomatic side. No evidence of thrombus. Normal compressibility. Common Femoral Vein: No evidence of thrombus. Normal compressibility, respiratory phasicity and response to augmentation. Saphenofemoral Junction: No evidence of thrombus. Normal compressibility and flow on color Doppler imaging. Profunda Femoral Vein: No evidence of thrombus. Normal compressibility and flow on color Doppler imaging. Femoral Vein: No evidence of thrombus. Normal compressibility, respiratory phasicity and response to augmentation. Popliteal Vein: No evidence of thrombus. Normal compressibility, respiratory phasicity and response to augmentation. Calf Veins: Incomplete visualization of the calf veins. Superficial Great Saphenous Vein: No evidence of thrombus. Normal compressibility and flow on color Doppler imaging. Other Findings:  None. IMPRESSION: Sonographic survey of the left lower extremity negative for DVT Electronically Signed   By: Corrie Mckusick D.O.   On: 09/09/2018 17:06    Procedures Procedures (including critical care time)  Medications Ordered in ED Medications  sodium  chloride 0.9 % bolus 1,000 mL (1,000 mLs Intravenous Incomplete 09/09/18 1812)  0.9 %  sodium chloride infusion (250 mLs Intravenous New Bag/Given 09/09/18 1811)  vancomycin (VANCOCIN) IVPB 1000 mg/200 mL premix (1,000 mg Intravenous New Bag/Given 09/09/18 1811)     Initial Impression / Assessment and Plan / ED Course  I have reviewed the triage vital signs and the nursing notes.  Pertinent labs & imaging results that were available during my care of the patient were reviewed by me and considered in my medical decision making (see chart for details).  Clinical Course as of Sep 09 1920  Wed Sep 09, 2018  1744 I have personally seen the xray - no  signs of fluid overload.  [place] without signs of DVT - there is an elevated WBC.   [BM]  9242 Metabolic panel is unremarkable except for glucose of 205.  No tachycardia, no fever, the patient will be able to be discharged home after IV antibiotics.  She is agreeable to the plan   [BM]    Clinical Course User Index [BM] Noemi Chapel, MD    There is no signs of pulmonary abnormalities, the patient is not hypoxic or shortness of breath at rest.  She has findings consistent with a cellulitis of her lower extremity but no DVT on the ultrasound.  The patient does have a leukocytosis of 17,000 but is tolerating food and fluids by mouth without difficulty, denies being hyperglycemic at home with blood sugars ranging in the 150 range over the last week and has no signs of severe sepsis.  Will check metabolic panel, start IV antibiotics with some fluids and transition to oral antibiotics for home if she tolerates this without any other signs of organ dysfunction.  The patient is agreeable to the plan, she wants to go back to work however I cautioned her against this for the next several days.  She is also aware of the reasons for return including spreading redness pain fever etc.  She is agreeable to the plan.  She does have this diabetic foot ulcer however it does  look clear around it and there does not appear to be any signs of purulent drainage or significant tenderness or swelling of the foot itself.  The lymphedema does predispose her to cellulitis, see picture below      Vitals:   09/09/18 1600 09/09/18 1604 09/09/18 1900  BP: (!) 162/78    Pulse: 98  77  Resp: (!) 22  18  Temp: 98.2 F (36.8 C)    TempSrc: Oral    SpO2: 96%  98%  Weight:  (!) 178.7 kg   Height:  1.702 m (5' 7" )      Final Clinical Impressions(s) / ED Diagnoses   Final diagnoses:  Cellulitis of left lower extremity  Lymphedema of both lower extremities    ED Discharge Orders         Ordered    doxycycline (VIBRAMYCIN) 100 MG capsule  2 times daily     09/09/18 1919           Noemi Chapel, MD 09/09/18 1921

## 2018-09-09 NOTE — ED Triage Notes (Signed)
PT was seen at [facility] today and was referred to ED due to left lower leg swelling and redness over the past two weeks as well as SOB on exertion more than her normal.

## 2018-09-15 DIAGNOSIS — E1142 Type 2 diabetes mellitus with diabetic polyneuropathy: Secondary | ICD-10-CM | POA: Diagnosis not present

## 2018-09-15 DIAGNOSIS — L97522 Non-pressure chronic ulcer of other part of left foot with fat layer exposed: Secondary | ICD-10-CM | POA: Diagnosis not present

## 2018-09-22 DIAGNOSIS — L97522 Non-pressure chronic ulcer of other part of left foot with fat layer exposed: Secondary | ICD-10-CM | POA: Diagnosis not present

## 2018-09-22 DIAGNOSIS — E1142 Type 2 diabetes mellitus with diabetic polyneuropathy: Secondary | ICD-10-CM | POA: Diagnosis not present

## 2018-09-29 DIAGNOSIS — E1142 Type 2 diabetes mellitus with diabetic polyneuropathy: Secondary | ICD-10-CM | POA: Diagnosis not present

## 2018-09-29 DIAGNOSIS — L97522 Non-pressure chronic ulcer of other part of left foot with fat layer exposed: Secondary | ICD-10-CM | POA: Diagnosis not present

## 2018-10-06 DIAGNOSIS — L97522 Non-pressure chronic ulcer of other part of left foot with fat layer exposed: Secondary | ICD-10-CM | POA: Diagnosis not present

## 2018-10-06 DIAGNOSIS — E1142 Type 2 diabetes mellitus with diabetic polyneuropathy: Secondary | ICD-10-CM | POA: Diagnosis not present

## 2018-10-13 DIAGNOSIS — E1142 Type 2 diabetes mellitus with diabetic polyneuropathy: Secondary | ICD-10-CM | POA: Diagnosis not present

## 2018-10-13 DIAGNOSIS — L97512 Non-pressure chronic ulcer of other part of right foot with fat layer exposed: Secondary | ICD-10-CM | POA: Diagnosis not present

## 2018-10-13 DIAGNOSIS — L97519 Non-pressure chronic ulcer of other part of right foot with unspecified severity: Secondary | ICD-10-CM | POA: Diagnosis not present

## 2018-10-13 DIAGNOSIS — L03116 Cellulitis of left lower limb: Secondary | ICD-10-CM | POA: Diagnosis not present

## 2018-10-21 DIAGNOSIS — L03116 Cellulitis of left lower limb: Secondary | ICD-10-CM | POA: Diagnosis not present

## 2018-10-21 DIAGNOSIS — Z6841 Body Mass Index (BMI) 40.0 and over, adult: Secondary | ICD-10-CM | POA: Diagnosis not present

## 2018-10-22 DIAGNOSIS — Z6841 Body Mass Index (BMI) 40.0 and over, adult: Secondary | ICD-10-CM | POA: Diagnosis not present

## 2018-10-22 DIAGNOSIS — L03116 Cellulitis of left lower limb: Secondary | ICD-10-CM | POA: Diagnosis not present

## 2018-10-23 DIAGNOSIS — L97512 Non-pressure chronic ulcer of other part of right foot with fat layer exposed: Secondary | ICD-10-CM | POA: Diagnosis not present

## 2018-10-23 DIAGNOSIS — E1142 Type 2 diabetes mellitus with diabetic polyneuropathy: Secondary | ICD-10-CM | POA: Diagnosis not present

## 2018-10-30 DIAGNOSIS — E1142 Type 2 diabetes mellitus with diabetic polyneuropathy: Secondary | ICD-10-CM | POA: Diagnosis not present

## 2018-10-30 DIAGNOSIS — L97512 Non-pressure chronic ulcer of other part of right foot with fat layer exposed: Secondary | ICD-10-CM | POA: Diagnosis not present

## 2018-11-06 DIAGNOSIS — L97512 Non-pressure chronic ulcer of other part of right foot with fat layer exposed: Secondary | ICD-10-CM | POA: Diagnosis not present

## 2018-11-06 DIAGNOSIS — E119 Type 2 diabetes mellitus without complications: Secondary | ICD-10-CM | POA: Diagnosis not present

## 2018-11-06 DIAGNOSIS — I890 Lymphedema, not elsewhere classified: Secondary | ICD-10-CM | POA: Diagnosis not present

## 2018-11-06 DIAGNOSIS — L03116 Cellulitis of left lower limb: Secondary | ICD-10-CM | POA: Diagnosis not present

## 2018-11-06 DIAGNOSIS — E1142 Type 2 diabetes mellitus with diabetic polyneuropathy: Secondary | ICD-10-CM | POA: Diagnosis not present

## 2018-11-12 DIAGNOSIS — E1165 Type 2 diabetes mellitus with hyperglycemia: Secondary | ICD-10-CM | POA: Diagnosis not present

## 2018-11-12 DIAGNOSIS — E782 Mixed hyperlipidemia: Secondary | ICD-10-CM | POA: Diagnosis not present

## 2018-11-13 DIAGNOSIS — L97512 Non-pressure chronic ulcer of other part of right foot with fat layer exposed: Secondary | ICD-10-CM | POA: Diagnosis not present

## 2018-11-13 DIAGNOSIS — E1142 Type 2 diabetes mellitus with diabetic polyneuropathy: Secondary | ICD-10-CM | POA: Diagnosis not present

## 2018-11-20 DIAGNOSIS — E1142 Type 2 diabetes mellitus with diabetic polyneuropathy: Secondary | ICD-10-CM | POA: Diagnosis not present

## 2018-11-20 DIAGNOSIS — L97522 Non-pressure chronic ulcer of other part of left foot with fat layer exposed: Secondary | ICD-10-CM | POA: Diagnosis not present

## 2018-11-27 DIAGNOSIS — L97512 Non-pressure chronic ulcer of other part of right foot with fat layer exposed: Secondary | ICD-10-CM | POA: Diagnosis not present

## 2018-11-27 DIAGNOSIS — E1142 Type 2 diabetes mellitus with diabetic polyneuropathy: Secondary | ICD-10-CM | POA: Diagnosis not present

## 2018-11-27 DIAGNOSIS — L97522 Non-pressure chronic ulcer of other part of left foot with fat layer exposed: Secondary | ICD-10-CM | POA: Diagnosis not present

## 2018-12-04 DIAGNOSIS — L97522 Non-pressure chronic ulcer of other part of left foot with fat layer exposed: Secondary | ICD-10-CM | POA: Diagnosis not present

## 2018-12-04 DIAGNOSIS — E1142 Type 2 diabetes mellitus with diabetic polyneuropathy: Secondary | ICD-10-CM | POA: Diagnosis not present

## 2018-12-11 DIAGNOSIS — L97522 Non-pressure chronic ulcer of other part of left foot with fat layer exposed: Secondary | ICD-10-CM | POA: Diagnosis not present

## 2018-12-11 DIAGNOSIS — E1142 Type 2 diabetes mellitus with diabetic polyneuropathy: Secondary | ICD-10-CM | POA: Diagnosis not present

## 2018-12-18 DIAGNOSIS — L97522 Non-pressure chronic ulcer of other part of left foot with fat layer exposed: Secondary | ICD-10-CM | POA: Diagnosis not present

## 2018-12-18 DIAGNOSIS — E1142 Type 2 diabetes mellitus with diabetic polyneuropathy: Secondary | ICD-10-CM | POA: Diagnosis not present

## 2018-12-21 DIAGNOSIS — E1142 Type 2 diabetes mellitus with diabetic polyneuropathy: Secondary | ICD-10-CM | POA: Diagnosis not present

## 2018-12-21 DIAGNOSIS — E782 Mixed hyperlipidemia: Secondary | ICD-10-CM | POA: Diagnosis not present

## 2018-12-21 DIAGNOSIS — E1165 Type 2 diabetes mellitus with hyperglycemia: Secondary | ICD-10-CM | POA: Diagnosis not present

## 2019-01-01 DIAGNOSIS — E1142 Type 2 diabetes mellitus with diabetic polyneuropathy: Secondary | ICD-10-CM | POA: Diagnosis not present

## 2019-01-01 DIAGNOSIS — L97522 Non-pressure chronic ulcer of other part of left foot with fat layer exposed: Secondary | ICD-10-CM | POA: Diagnosis not present

## 2019-01-12 IMAGING — DX DG CHEST 2V
2 series · 2 of 2 positions shown · non-contrast
Comparison: 03/28/2012

CLINICAL DATA: Swelling in the left leg. Pain and discomfort with
standing.

EXAM:
CHEST - 2 VIEW

[chest pa]
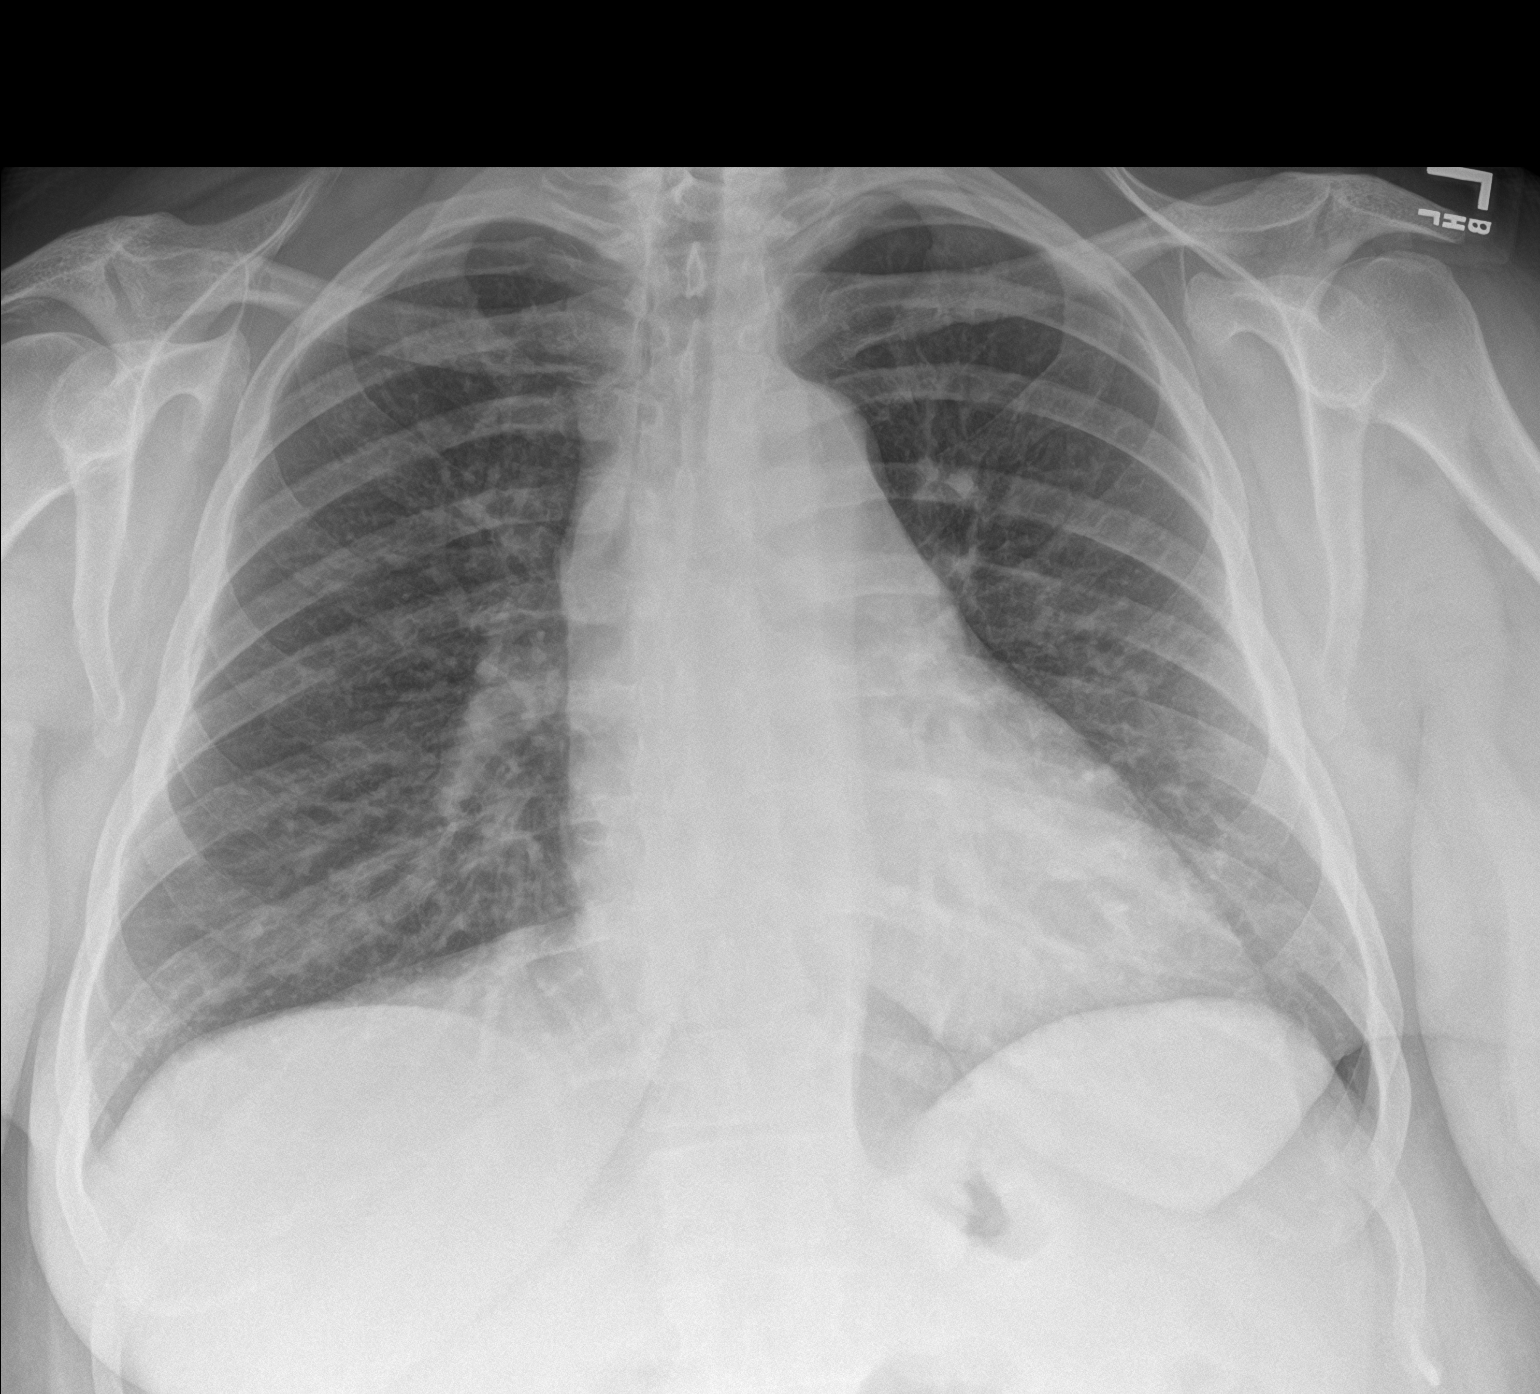

[chest lat]
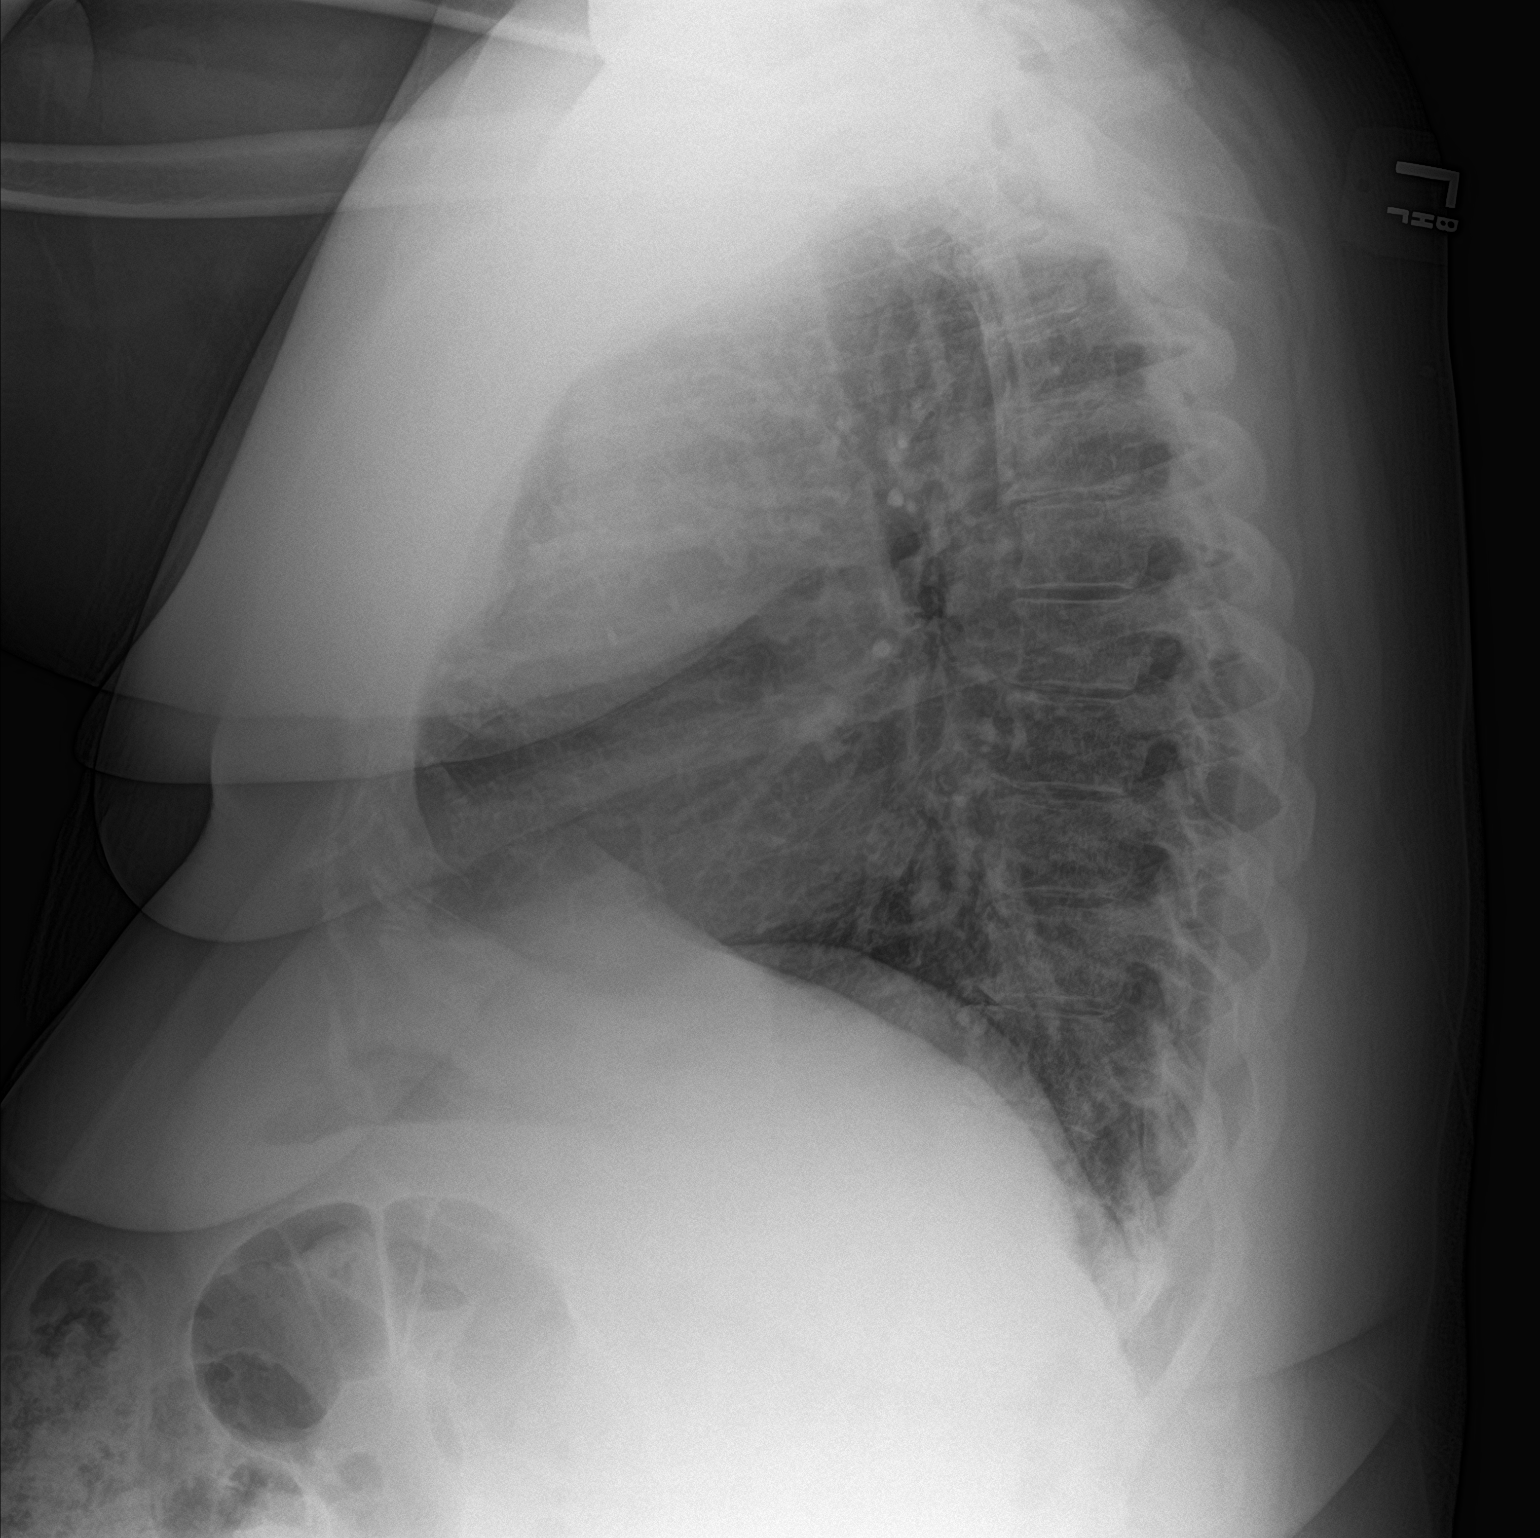

[2 of 2 positions shown; findings below may reference images not displayed]

FINDINGS: Borderline enlargement of the cardiopericardial silhouette. No overt
edema. The lungs appear clear. No pleural effusion. Mild thoracic
spondylosis.
IMPRESSION: 1. Borderline enlargement of the cardiopericardial silhouette,
without edema.
2. Mild thoracic spondylosis.

## 2019-01-19 DIAGNOSIS — E1142 Type 2 diabetes mellitus with diabetic polyneuropathy: Secondary | ICD-10-CM | POA: Diagnosis not present

## 2019-01-19 DIAGNOSIS — L97522 Non-pressure chronic ulcer of other part of left foot with fat layer exposed: Secondary | ICD-10-CM | POA: Diagnosis not present

## 2019-01-19 DIAGNOSIS — L97512 Non-pressure chronic ulcer of other part of right foot with fat layer exposed: Secondary | ICD-10-CM | POA: Diagnosis not present

## 2019-01-26 DIAGNOSIS — L97522 Non-pressure chronic ulcer of other part of left foot with fat layer exposed: Secondary | ICD-10-CM | POA: Diagnosis not present

## 2019-01-26 DIAGNOSIS — L97512 Non-pressure chronic ulcer of other part of right foot with fat layer exposed: Secondary | ICD-10-CM | POA: Diagnosis not present

## 2019-02-09 DIAGNOSIS — E1142 Type 2 diabetes mellitus with diabetic polyneuropathy: Secondary | ICD-10-CM | POA: Diagnosis not present

## 2019-02-09 DIAGNOSIS — L97512 Non-pressure chronic ulcer of other part of right foot with fat layer exposed: Secondary | ICD-10-CM | POA: Diagnosis not present

## 2019-02-09 DIAGNOSIS — L97522 Non-pressure chronic ulcer of other part of left foot with fat layer exposed: Secondary | ICD-10-CM | POA: Diagnosis not present

## 2019-02-23 DIAGNOSIS — L97522 Non-pressure chronic ulcer of other part of left foot with fat layer exposed: Secondary | ICD-10-CM | POA: Diagnosis not present

## 2019-02-23 DIAGNOSIS — E1142 Type 2 diabetes mellitus with diabetic polyneuropathy: Secondary | ICD-10-CM | POA: Diagnosis not present

## 2019-02-23 DIAGNOSIS — L97512 Non-pressure chronic ulcer of other part of right foot with fat layer exposed: Secondary | ICD-10-CM | POA: Diagnosis not present

## 2019-03-02 DIAGNOSIS — L97512 Non-pressure chronic ulcer of other part of right foot with fat layer exposed: Secondary | ICD-10-CM | POA: Diagnosis not present

## 2019-03-02 DIAGNOSIS — L97522 Non-pressure chronic ulcer of other part of left foot with fat layer exposed: Secondary | ICD-10-CM | POA: Diagnosis not present

## 2019-03-02 DIAGNOSIS — E1142 Type 2 diabetes mellitus with diabetic polyneuropathy: Secondary | ICD-10-CM | POA: Diagnosis not present

## 2019-03-09 DIAGNOSIS — L97522 Non-pressure chronic ulcer of other part of left foot with fat layer exposed: Secondary | ICD-10-CM | POA: Diagnosis not present

## 2019-03-09 DIAGNOSIS — L97512 Non-pressure chronic ulcer of other part of right foot with fat layer exposed: Secondary | ICD-10-CM | POA: Diagnosis not present

## 2019-03-09 DIAGNOSIS — E1142 Type 2 diabetes mellitus with diabetic polyneuropathy: Secondary | ICD-10-CM | POA: Diagnosis not present

## 2019-03-16 DIAGNOSIS — L97512 Non-pressure chronic ulcer of other part of right foot with fat layer exposed: Secondary | ICD-10-CM | POA: Diagnosis not present

## 2019-03-16 DIAGNOSIS — E1142 Type 2 diabetes mellitus with diabetic polyneuropathy: Secondary | ICD-10-CM | POA: Diagnosis not present

## 2019-03-16 DIAGNOSIS — L97522 Non-pressure chronic ulcer of other part of left foot with fat layer exposed: Secondary | ICD-10-CM | POA: Diagnosis not present

## 2019-03-26 DIAGNOSIS — L97522 Non-pressure chronic ulcer of other part of left foot with fat layer exposed: Secondary | ICD-10-CM | POA: Diagnosis not present

## 2019-03-26 DIAGNOSIS — E1142 Type 2 diabetes mellitus with diabetic polyneuropathy: Secondary | ICD-10-CM | POA: Diagnosis not present

## 2019-04-02 DIAGNOSIS — L97522 Non-pressure chronic ulcer of other part of left foot with fat layer exposed: Secondary | ICD-10-CM | POA: Diagnosis not present

## 2019-04-02 DIAGNOSIS — E1142 Type 2 diabetes mellitus with diabetic polyneuropathy: Secondary | ICD-10-CM | POA: Diagnosis not present

## 2022-05-16 ENCOUNTER — Other Ambulatory Visit (HOSPITAL_COMMUNITY): Payer: Self-pay | Admitting: Family Medicine

## 2022-05-16 DIAGNOSIS — Z1231 Encounter for screening mammogram for malignant neoplasm of breast: Secondary | ICD-10-CM

## 2022-08-29 ENCOUNTER — Other Ambulatory Visit (HOSPITAL_COMMUNITY): Payer: Self-pay | Admitting: Podiatry

## 2022-08-29 ENCOUNTER — Ambulatory Visit (HOSPITAL_COMMUNITY)
Admission: RE | Admit: 2022-08-29 | Discharge: 2022-08-29 | Disposition: A | Discharge status: Home / Self Care | Payer: 59 | Source: Ambulatory Visit | Attending: Podiatry | Admitting: Podiatry

## 2022-08-29 DIAGNOSIS — M79672 Pain in left foot: Secondary | ICD-10-CM

## 2024-05-20 ENCOUNTER — Other Ambulatory Visit (HOSPITAL_COMMUNITY): Payer: Self-pay | Admitting: Family Medicine

## 2024-05-20 DIAGNOSIS — Z1231 Encounter for screening mammogram for malignant neoplasm of breast: Secondary | ICD-10-CM

## 2024-05-28 ENCOUNTER — Ambulatory Visit (HOSPITAL_COMMUNITY)
